# Patient Record
Sex: Female | Born: 1960 | Race: Black or African American | Hispanic: No | Smoking: Current every day smoker
Health system: Southern US, Community
[De-identification: ages and names within clinical notes are randomized; demographics above are authoritative.]

## PROBLEM LIST (undated history)

## (undated) DIAGNOSIS — I10 Essential (primary) hypertension: Secondary | ICD-10-CM

## (undated) DIAGNOSIS — C801 Malignant (primary) neoplasm, unspecified: Secondary | ICD-10-CM

## (undated) DIAGNOSIS — Z8673 Personal history of transient ischemic attack (TIA), and cerebral infarction without residual deficits: Secondary | ICD-10-CM

## (undated) DIAGNOSIS — E119 Type 2 diabetes mellitus without complications: Secondary | ICD-10-CM

## (undated) DIAGNOSIS — R569 Unspecified convulsions: Secondary | ICD-10-CM

## (undated) DIAGNOSIS — J45909 Unspecified asthma, uncomplicated: Secondary | ICD-10-CM

## (undated) DIAGNOSIS — Z720 Tobacco use: Secondary | ICD-10-CM

## (undated) DIAGNOSIS — I739 Peripheral vascular disease, unspecified: Secondary | ICD-10-CM

## (undated) DIAGNOSIS — M1990 Unspecified osteoarthritis, unspecified site: Secondary | ICD-10-CM

## (undated) DIAGNOSIS — I779 Disorder of arteries and arterioles, unspecified: Secondary | ICD-10-CM

## (undated) HISTORY — PX: OTHER SURGICAL HISTORY: SHX169

---

## 2016-11-08 ENCOUNTER — Emergency Department: Payer: Medicare Other

## 2016-11-08 ENCOUNTER — Emergency Department
Admission: EM | Admit: 2016-11-08 | Discharge: 2016-11-09 | Disposition: A | Discharge status: Home / Self Care | Payer: Medicare Other | Attending: Emergency Medicine | Admitting: Emergency Medicine

## 2016-11-08 ENCOUNTER — Encounter: Payer: Self-pay | Admitting: Emergency Medicine

## 2016-11-08 DIAGNOSIS — Z8543 Personal history of malignant neoplasm of ovary: Secondary | ICD-10-CM | POA: Diagnosis not present

## 2016-11-08 DIAGNOSIS — R079 Chest pain, unspecified: Secondary | ICD-10-CM | POA: Insufficient documentation

## 2016-11-08 DIAGNOSIS — E119 Type 2 diabetes mellitus without complications: Secondary | ICD-10-CM | POA: Insufficient documentation

## 2016-11-08 DIAGNOSIS — R0789 Other chest pain: Secondary | ICD-10-CM | POA: Diagnosis present

## 2016-11-08 DIAGNOSIS — J45909 Unspecified asthma, uncomplicated: Secondary | ICD-10-CM | POA: Diagnosis not present

## 2016-11-08 DIAGNOSIS — I10 Essential (primary) hypertension: Secondary | ICD-10-CM | POA: Diagnosis not present

## 2016-11-08 DIAGNOSIS — F17200 Nicotine dependence, unspecified, uncomplicated: Secondary | ICD-10-CM | POA: Diagnosis not present

## 2016-11-08 HISTORY — DX: Unspecified osteoarthritis, unspecified site: M19.90

## 2016-11-08 HISTORY — DX: Unspecified asthma, uncomplicated: J45.909

## 2016-11-08 HISTORY — DX: Essential (primary) hypertension: I10

## 2016-11-08 HISTORY — DX: Type 2 diabetes mellitus without complications: E11.9

## 2016-11-08 HISTORY — DX: Malignant (primary) neoplasm, unspecified: C80.1

## 2016-11-08 LAB — BASIC METABOLIC PANEL
ANION GAP: 5 (ref 5–15)
BUN: 24 mg/dL — AB (ref 6–20)
CHLORIDE: 109 mmol/L (ref 101–111)
CO2: 30 mmol/L (ref 22–32)
Calcium: 8.9 mg/dL (ref 8.9–10.3)
Creatinine, Ser: 1.23 mg/dL — ABNORMAL HIGH (ref 0.44–1.00)
GFR calc Af Amer: 56 mL/min — ABNORMAL LOW (ref >60)
GFR calc non Af Amer: 48 mL/min — ABNORMAL LOW (ref >60)
GLUCOSE: 113 mg/dL — AB (ref 65–99)
POTASSIUM: 3 mmol/L — AB (ref 3.5–5.1)
Sodium: 144 mmol/L (ref 135–145)

## 2016-11-08 LAB — TROPONIN I: Troponin I: <0.03 ng/mL (ref <0.03)

## 2016-11-08 LAB — CBC
HEMATOCRIT: 37.2 % (ref 35.0–47.0)
HEMOGLOBIN: 12.7 g/dL (ref 12.0–16.0)
MCH: 31.2 pg (ref 26.0–34.0)
MCHC: 34.2 g/dL (ref 32.0–36.0)
MCV: 91.3 fL (ref 80.0–100.0)
Platelets: 141 10*3/uL — ABNORMAL LOW (ref 150–440)
RBC: 4.08 MIL/uL (ref 3.80–5.20)
RDW: 15.8 % — AB (ref 11.5–14.5)
WBC: 8.1 10*3/uL (ref 3.6–11.0)

## 2016-11-08 MED ORDER — IPRATROPIUM-ALBUTEROL 0.5-2.5 (3) MG/3ML IN SOLN
3.0000 mL | Freq: Once | RESPIRATORY_TRACT | Status: AC
  Administered 2016-11-08: 3 mL via RESPIRATORY_TRACT
  Filled 2016-11-08: qty 3

## 2016-11-08 MED ORDER — GI COCKTAIL ~~LOC~~
30.0000 mL | Freq: Once | ORAL | Status: AC
Start: 2016-11-08 — End: 2016-11-08
  Administered 2016-11-08: 30 mL via ORAL
  Filled 2016-11-08: qty 30

## 2016-11-08 MED ORDER — LIDOCAINE 5 % EX PTCH
1.0000 | MEDICATED_PATCH | CUTANEOUS | Status: DC
  Administered 2016-11-08: 1 via TRANSDERMAL
  Filled 2016-11-08: qty 1

## 2016-11-08 MED ORDER — KETOROLAC TROMETHAMINE 30 MG/ML IJ SOLN
30.0000 mg | Freq: Once | INTRAMUSCULAR | Status: AC
  Administered 2016-11-08: 30 mg via INTRAVENOUS
  Filled 2016-11-08: qty 1

## 2016-11-08 NOTE — ED Notes (Signed)
Breathing treatment is complete. Patient to radiology with rad tech via stretcher. Patient's family members to go home at this time.

## 2016-11-08 NOTE — ED Triage Notes (Addendum)
Pt to rm 12 via EMS, EMS reports they found pt in car, shaking.  Pt c/o central chest pain, described as squeezing and sharp, no accompanying sx.  Pt reports she has had these pains since she was a teenager, but has never seen a cardiologist.  Pt appears confused upon arrival, having difficulty answering questions, unsure if this is pt's baseline.    EMS gave pt 324 baby asa and 1/2 inch nitro paste

## 2016-11-09 ENCOUNTER — Emergency Department: Payer: Medicare Other

## 2016-11-09 DIAGNOSIS — R079 Chest pain, unspecified: Secondary | ICD-10-CM | POA: Diagnosis not present

## 2016-11-09 LAB — FIBRIN DERIVATIVES D-DIMER (ARMC ONLY): FIBRIN DERIVATIVES D-DIMER (ARMC): 589 — AB (ref 0–499)

## 2016-11-09 LAB — BLOOD GAS, VENOUS
Acid-Base Excess: 5.9 mmol/L — ABNORMAL HIGH (ref 0.0–2.0)
Bicarbonate: 31.7 mmol/L — ABNORMAL HIGH (ref 20.0–28.0)
FIO2: 0.21
O2 Saturation: 68 %
PCO2 VEN: 50 mmHg (ref 44.0–60.0)
PH VEN: 7.41 (ref 7.250–7.430)
PO2 VEN: 35 mmHg (ref 32.0–45.0)
Patient temperature: 37

## 2016-11-09 LAB — TROPONIN I

## 2016-11-09 MED ORDER — POTASSIUM CHLORIDE CRYS ER 20 MEQ PO TBCR
40.0000 meq | EXTENDED_RELEASE_TABLET | Freq: Once | ORAL | Status: AC
  Administered 2016-11-09: 40 meq via ORAL
  Filled 2016-11-09: qty 2

## 2016-11-09 MED ORDER — ETODOLAC 200 MG PO CAPS: 200.0000 mg | ORAL_CAPSULE | Freq: Three times a day (TID) | ORAL | 0 refills | Status: DC

## 2016-11-09 MED ORDER — MAGNESIUM SULFATE 2 GM/50ML IV SOLN
2.0000 g | Freq: Once | INTRAVENOUS | Status: AC
  Administered 2016-11-09: 2 g via INTRAVENOUS
  Filled 2016-11-09: qty 50

## 2016-11-09 MED ORDER — IOPAMIDOL (ISOVUE-370) INJECTION 76%
75.0000 mL | Freq: Once | INTRAVENOUS | Status: AC | PRN
  Administered 2016-11-09: 75 mL via INTRAVENOUS

## 2016-11-09 NOTE — ED Notes (Signed)
Patient continues to sleep soundly. Awakens to touch. IV magnesium is complete. Patient is ready for discharge. Will call family to pick her up.

## 2016-11-09 NOTE — ED Notes (Signed)
Patient verbalized understanding of discharge instructions. Awaiting transportation.

## 2016-11-09 NOTE — Discharge Instructions (Signed)
Please follow-up with the acute care clinic for your chest pain. Your heart enzymes and heart evaluation is unremarkable. If you pain should return or worsen please return to the emergency department. The concerns please return to the emergency department.

## 2016-11-09 NOTE — ED Notes (Signed)
Patients daughter requesting a phone call for any changes.[phone]

## 2016-11-09 NOTE — ED Provider Notes (Signed)
[facility] Emergency Department Provider Note   ____________________________________________   First MD Initiated Contact with Patient 11/08/16 2325     (approximate)  I have reviewed the triage vital signs and the nursing notes.   HISTORY  Chief Complaint Chest Pain    HPI Jennifer Weiss is a 55 y.o. female who comes into the hospital today with chest pain. The patient reports that the pain started around lunchtime. The pain went away and then came back to see me. She was at her brother's house sitting and relaxing. She went to the bathroom and this pain started. The patient reports the pain is in the middle of her chest and feels like his pressure and something squeezing. Patient denies any radiation of her pain and denies any shortness of breath. Rates her pain is worse when she takes a deep breath. The pain is also worse when the patient moves. She's had some sweats but denies dizziness, nausea, vomiting. The patient has had this pain before and reports she's had all of her life. She sees Dr. Nicki Reaper in [place]. The patient currently rates her pain a 9 out of 10 in intensity. She has no abdominal pain and no headache. EMS gave the patient aspirin and Nitropaste. According to the patient's brother he felt like she stopped breathing and EMS revived her but that was not reported.   Past Medical History:  Diagnosis Date  . Arthritis   . Asthma   . Cancer ([place])    ovarian  . Diabetes mellitus without complication ([place])   . Hypertension     There are no active problems to display for this patient.   History reviewed. No pertinent surgical history.  Prior to Admission medications   Medication Sig Start Date End Date Taking? Authorizing Provider  etodolac (LODINE) 200 MG capsule Take 1 capsule (200 mg total) by mouth every 8 (eight) hours. 11/09/16   Loney Hering, MD    Allergies Nystatin  History reviewed. No pertinent family  history.  Social History Social History  Substance Use Topics  . Smoking status: Current Every Day Smoker  . Smokeless tobacco: Never Used  . Alcohol use No    Review of Systems Constitutional: Sweats Eyes: No visual changes. ENT: No sore throat. Cardiovascular:  chest pain. Respiratory: Denies shortness of breath. Gastrointestinal: No abdominal pain.  No nausea, no vomiting.  No diarrhea.  No constipation. Genitourinary: Negative for dysuria. Musculoskeletal: Negative for back pain. Skin: Negative for rash. Neurological: Negative for headaches, focal weakness or numbness.  10-point ROS otherwise negative.  ____________________________________________   PHYSICAL EXAM:  VITAL SIGNS: ED Triage Vitals [11/08/16 2315]  Enc Vitals Group     BP (!) 151/82     Pulse Rate 63     Resp 16     Temp 98.6 F (37 C)     Temp Source Oral     SpO2 96 %     Weight 220 lb (99.8 kg)     Height 5\' 3"  (1.6 m)     Head Circumference      Peak Flow      Pain Score 9     Pain Loc      Pain Edu?      Excl. in [place]?     Constitutional: Alert and oriented. Well appearing and in Moderate distress. Eyes: Conjunctivae are normal. PERRL. EOMI. Head: Atraumatic. Nose: No congestion/rhinnorhea. Mouth/Throat: Mucous membranes are moist.  Oropharynx non-erythematous. Cardiovascular: Normal rate, regular  rhythm. Grossly normal heart sounds.  Good peripheral circulation. Respiratory: Normal respiratory effort.  No retractions. Lungs CTAB. Tenderness to palpation of right chest wall Gastrointestinal: Soft and nontender. No distention. Positive bowel sounds Musculoskeletal: No lower extremity tenderness nor edema.   Neurologic:  Normal speech and language.  Skin:  Skin is warm, dry and intact.  Psychiatric: Mood and affect are normal.   ____________________________________________   LABS (all labs ordered are listed, but only abnormal results are displayed)  Labs Reviewed  BASIC METABOLIC  PANEL - Abnormal; Notable for the following:       Result Value   Potassium 3.0 (*)    Glucose, Bld 113 (*)    BUN 24 (*)    Creatinine, Ser 1.23 (*)    GFR calc non Af Amer 48 (*)    GFR calc Af Amer 56 (*)    All other components within normal limits  CBC - Abnormal; Notable for the following:    RDW 15.8 (*)    Platelets 141 (*)    All other components within normal limits  FIBRIN DERIVATIVES D-DIMER ([facility] ONLY) - Abnormal; Notable for the following:    Fibrin derivatives D-dimer ([facility]) 589 (*)    All other components within normal limits  BLOOD GAS, VENOUS - Abnormal; Notable for the following:    Bicarbonate 31.7 (*)    Acid-Base Excess 5.9 (*)    All other components within normal limits  TROPONIN I  TROPONIN I   ____________________________________________  EKG  ED ECG REPORT I, Loney Hering, the attending physician, personally viewed and interpreted this ECG.   Date: 11/08/2016  EKG Time: 2315  Rate: 80  Rhythm: normal sinus rhythm, PAC's noted  Axis: normal  Intervals:none  ST&T Change: none  ____________________________________________  RADIOLOGY  CXR CT angio chest ____________________________________________   PROCEDURES  Procedure(s) performed: None  Procedures  Critical Care performed: No  ____________________________________________   INITIAL IMPRESSION / ASSESSMENT AND PLAN / ED COURSE  Pertinent labs & imaging results that were available during my care of the patient were reviewed by me and considered in my medical decision making (see chart for details).  This is a 55 year old female who comes into the hospital today with chest pain. The patient reports that this pain has been going on all day and she's had it multiple times in the past. I will give the patient a Lidoderm patch to her chest that she is very tender to palpation in her chest. I will also give the patient a dose of ibuprofen. She will also receive a GI cocktail. I  will attempt to give her a DuoNeb as she did have some mild wheezing. I will evaluate the patient's imaging studies and I will reassess the patient when she's received everything.  Clinical Course as of Nov 10 351  Nancy Fetter Nov 09, 2016  [id number] Vascular congestion and mild cardiomegaly. Lungs remain grossly clear.   DG Chest 2 View [AW]  0201 No evidence of significant pulmonary embolus. No evidence of active pulmonary disease.   CT Angio Chest PE W and/or Wo Contrast [AW]    Clinical Course User Index [AW] Loney Hering, MD   Patient's chest pain is improved. Her CT as well as her chest x-ray is unremarkable. She has no further complaints at this time. The patient will be discharged home to follow-up with the acute care clinic.  ____________________________________________   FINAL CLINICAL IMPRESSION(S) / ED DIAGNOSES  Final diagnoses:  Chest pain,  unspecified type      NEW MEDICATIONS STARTED DURING THIS VISIT:  New Prescriptions   ETODOLAC (LODINE) 200 MG CAPSULE    Take 1 capsule (200 mg total) by mouth every 8 (eight) hours.     Note:  This document was prepared using Dragon voice recognition software and may include unintentional dictation errors.    Loney Hering, MD 11/09/16 [phone]

## 2016-11-09 NOTE — ED Notes (Signed)
Called pt's daughter, let her know plan to discharge, daughter request call again when pt up for discharge.  [phone]

## 2016-11-09 NOTE — ED Notes (Signed)
Patient asleep. Arouses with touch. IV meds running at this time. Will continue to monitor. Patient repositioned in bed for comfort.

## 2016-11-09 NOTE — ED Notes (Signed)
Pt ambulated to toilet with assistance, pt c/o chronic pain to right leg.  When asked if she normally uses an assistive device, pt states no but states she needs something.  Walker discussed with MD and to be provided at discharge

## 2016-11-09 NOTE — ED Notes (Addendum)
Pt's female family member out to nurse's station, family member given update, family member tried to explain to this nurse what happened to pt PTA, misunderstanding took place and family member became angry.  Family member was stating pt was not breathing and was "brought back", this was misinterpreted by this nurse that pt was picked up by EMS, brought back home, but then later brought to hospital.  Of note, respiratory arrest never mentioned by EMS.  [employer] to nurse's situation and escorted family member to lobby

## 2016-11-09 NOTE — ED Notes (Signed)
Patient returned from xray.

## 2016-11-19 ENCOUNTER — Emergency Department: Payer: Medicare Other

## 2016-11-19 ENCOUNTER — Encounter: Payer: Self-pay | Admitting: Emergency Medicine

## 2016-11-19 ENCOUNTER — Emergency Department
Admission: EM | Admit: 2016-11-19 | Discharge: 2016-11-19 | Disposition: A | Discharge status: Home / Self Care | Payer: Medicare Other | Attending: Emergency Medicine | Admitting: Emergency Medicine

## 2016-11-19 DIAGNOSIS — J45909 Unspecified asthma, uncomplicated: Secondary | ICD-10-CM | POA: Insufficient documentation

## 2016-11-19 DIAGNOSIS — F17200 Nicotine dependence, unspecified, uncomplicated: Secondary | ICD-10-CM | POA: Insufficient documentation

## 2016-11-19 DIAGNOSIS — I10 Essential (primary) hypertension: Secondary | ICD-10-CM | POA: Insufficient documentation

## 2016-11-19 DIAGNOSIS — Z8543 Personal history of malignant neoplasm of ovary: Secondary | ICD-10-CM | POA: Diagnosis not present

## 2016-11-19 DIAGNOSIS — R42 Dizziness and giddiness: Secondary | ICD-10-CM | POA: Insufficient documentation

## 2016-11-19 DIAGNOSIS — E119 Type 2 diabetes mellitus without complications: Secondary | ICD-10-CM | POA: Insufficient documentation

## 2016-11-19 LAB — CBC
HCT: 37.1 % (ref 35.0–47.0)
Hemoglobin: 12.4 g/dL (ref 12.0–16.0)
MCH: 30.3 pg (ref 26.0–34.0)
MCHC: 33.5 g/dL (ref 32.0–36.0)
MCV: 90.4 fL (ref 80.0–100.0)
PLATELETS: 176 10*3/uL (ref 150–440)
RBC: 4.1 MIL/uL (ref 3.80–5.20)
RDW: 16.2 % — AB (ref 11.5–14.5)
WBC: 7 10*3/uL (ref 3.6–11.0)

## 2016-11-19 LAB — BASIC METABOLIC PANEL
ANION GAP: 9 (ref 5–15)
BUN: 18 mg/dL (ref 6–20)
CALCIUM: 9.1 mg/dL (ref 8.9–10.3)
CO2: 26 mmol/L (ref 22–32)
Chloride: 107 mmol/L (ref 101–111)
Creatinine, Ser: 0.93 mg/dL (ref 0.44–1.00)
GLUCOSE: 93 mg/dL (ref 65–99)
Potassium: 3.3 mmol/L — ABNORMAL LOW (ref 3.5–5.1)
Sodium: 142 mmol/L (ref 135–145)

## 2016-11-19 LAB — TROPONIN I
Troponin I: <0.03 ng/mL (ref <0.03)
Troponin I: <0.03 ng/mL (ref <0.03)

## 2016-11-19 MED ORDER — CLONIDINE HCL 0.1 MG PO TABS
0.1000 mg | ORAL_TABLET | Freq: Once | ORAL | Status: AC
  Administered 2016-11-19: 0.1 mg via ORAL
  Filled 2016-11-19: qty 1

## 2016-11-19 MED ORDER — SODIUM CHLORIDE 0.9 % IV BOLUS (SEPSIS)
500.0000 mL | Freq: Once | INTRAVENOUS | Status: AC
  Administered 2016-11-19: 500 mL via INTRAVENOUS

## 2016-11-19 MED ORDER — KETOROLAC TROMETHAMINE 30 MG/ML IJ SOLN
30.0000 mg | Freq: Once | INTRAMUSCULAR | Status: AC
  Administered 2016-11-19: 30 mg via INTRAVENOUS
  Filled 2016-11-19: qty 1

## 2016-11-19 MED ORDER — IPRATROPIUM-ALBUTEROL 0.5-2.5 (3) MG/3ML IN SOLN
3.0000 mL | Freq: Once | RESPIRATORY_TRACT | Status: AC
  Administered 2016-11-19: 3 mL via RESPIRATORY_TRACT
  Filled 2016-11-19: qty 3

## 2016-11-19 NOTE — ED Triage Notes (Addendum)
Pt to room 6 via EMS from home; no distress noted; EMS reports pt's BP 210/98; pt reports awoke PTA with dizziness; st hx of same with HTN; reports mid CP which is constant and chronic; resp even/unlab, lungs clear, apical audible & regular, +BS, abd soft/nondist/nontender, +PP, -edema; pt reports seeing her PCP yesterday for physical and BP was elevated, rx new medication (unknow) but has not filled yet

## 2016-11-19 NOTE — ED Notes (Signed)
Pt resting quietly in darkened exam room with eyes closed, snoring resp; awakens easily with stimuli; st decreased CP at present, 4/10

## 2016-11-19 NOTE — ED Notes (Signed)
Pt returns from xray; voices good understanding of plan of care and medication to be admin

## 2016-11-19 NOTE — ED Provider Notes (Signed)
Sign out from Dr. Dahlia Client in this 55 year old female presenting with dizziness. History of hypertension as well as diabetes. Evaluated by Dr. Dahlia Client and found to be neuro intact without any dizziness. Plan is to follow the second troponin and discharge if stable.  Physical Exam  BP (!) 178/96   Pulse (!) 49   Temp 98 F (36.7 C) (Oral)   Resp 17   Ht 5\' 9"  (1.753 m)   Wt 209 lb (94.8 kg)   SpO2 96%   BMI 30.86 kg/m  ----------------------------------------- 9:45 AM on 11/19/2016 -----------------------------------------   Physical Exam Patient resting couple bits time. ED Course  Procedures  MDM No further complaints of dizziness or vertigo here in the emergency department. Blood pressure improved from EMS transfer. Patient will be discharged home with follow-up with her primary care doctor. Explained the plan to the patient and she is understandable and willing to comply.       Orbie Pyo, MD 11/19/16 [phone]

## 2016-11-19 NOTE — ED Provider Notes (Signed)
[facility] Emergency Department Provider Note   ____________________________________________   First MD Initiated Contact with Patient 11/19/16 [phone]     (approximate)  I have reviewed the triage vital signs and the nursing notes.   HISTORY  Chief Complaint Dizziness    HPI Jennifer Weiss is a 55 y.o. female who comes into the hospital today with some dizziness. The patient reports that she went to the doctor yesterday and her blood pressure was high. The patient was given a prescription for a new medication for her blood pressure but reports that she has not yet filled the prescription. She does not remember how high her blood pressure was. This evening she got up to use the restroom and reports that she had a dizzy spell. It started around 2:30 and it lasted about an hour. The patient reports that the symptoms have resolved but she was concerned. The patient reports that she just feels bad so she wanted to get checked out. The patient does have some chest pain but denies any shortness of breath. She reports that she is dizzy with some mild room spinning but nothing severe. The patient denies any nausea vomiting or abdominal pain. She's had no urinary symptoms. The patient is here today for evaluation.According to EMS the patient's blood pressure was 210/98. The patient did state that she does have some dizziness when her blood pressure is elevated.   Past Medical History:  Diagnosis Date  . Arthritis   . Asthma   . Cancer ([place])    ovarian  . Diabetes mellitus without complication ([place])   . Hypertension     There are no active problems to display for this patient.   History reviewed. No pertinent surgical history.  Prior to Admission medications   Medication Sig Start Date End Date Taking? Authorizing Provider  etodolac (LODINE) 200 MG capsule Take 1 capsule (200 mg total) by mouth every 8 (eight) hours. 11/09/16   Loney Hering, MD     Allergies Nystatin  No family history on file.  Social History Social History  Substance Use Topics  . Smoking status: Current Every Day Smoker  . Smokeless tobacco: Never Used  . Alcohol use No    Review of Systems Constitutional: No fever/chills Eyes: No visual changes. ENT: No sore throat. Cardiovascular:  chest pain. Respiratory: Denies shortness of breath. Gastrointestinal: No abdominal pain.  No nausea, no vomiting.  No diarrhea.  No constipation. Genitourinary: Negative for dysuria. Musculoskeletal: Negative for back pain. Skin: Negative for rash. Neurological: Dizziness   10-point ROS otherwise negative.  ____________________________________________   PHYSICAL EXAM:  VITAL SIGNS: ED Triage Vitals  Enc Vitals Group     BP 11/19/16 0425 (!) 182/82     Pulse Rate 11/19/16 0425 (!) 49     Resp 11/19/16 0425 20     Temp 11/19/16 0425 98 F (36.7 C)     Temp Source 11/19/16 0425 Oral     SpO2 11/19/16 0425 99 %     Weight 11/19/16 0437 209 lb (94.8 kg)     Height 11/19/16 0437 5\' 9"  (1.753 m)     Head Circumference --      Peak Flow --      Pain Score 11/19/16 0438 8     Pain Loc --      Pain Edu? --      Excl. in [place]? --     Constitutional: Alert and oriented. Well appearing and in Mild distress. Eyes:  Conjunctivae are normal. PERRL. EOMI. Head: Atraumatic. Nose: No congestion/rhinnorhea. Mouth/Throat: Mucous membranes are moist.  Oropharynx non-erythematous. Cardiovascular: Normal rate, regular rhythm. Grossly normal heart sounds.  Good peripheral circulation. Respiratory: Normal respiratory effort.  No retractions. Lungs CTAB. Gastrointestinal: Soft and nontender. No distention. Positive bowel sounds Musculoskeletal: Right shoulder pain with movement   Neurologic:  Normal speech and language. Renal nerves II through XII grossly intact with no focal motor or neuro deficits Skin:  Skin is warm, dry and intact.  Psychiatric: Mood and affect are  normal.   ____________________________________________   LABS (all labs ordered are listed, but only abnormal results are displayed)  Labs Reviewed  CBC - Abnormal; Notable for the following:       Result Value   RDW 16.2 (*)    All other components within normal limits  BASIC METABOLIC PANEL - Abnormal; Notable for the following:    Potassium 3.3 (*)    All other components within normal limits  TROPONIN I   ____________________________________________  EKG  ED ECG REPORT I, Loney Hering, the attending physician, personally viewed and interpreted this ECG.   Date: 11/19/2016  EKG Time: 429  Rate: 50  Rhythm: sinus bradycardia  Axis: normal  Intervals:none  ST&T Change: Flipped T-wave in lead 3 diffuse T-wave flattening  ____________________________________________  RADIOLOGY  CXR ____________________________________________   PROCEDURES  Procedure(s) performed: None  Procedures  Critical Care performed: No  ____________________________________________   INITIAL IMPRESSION / ASSESSMENT AND PLAN / ED COURSE  Pertinent labs & imaging results that were available during my care of the patient were reviewed by me and considered in my medical decision making (see chart for details).  This is a 55 year old female who comes into the hospital today with elevated blood pressure and dizziness. The patient does have dizziness normally when her blood pressure is up and she was seen at her primary care physician's office today with elevated blood pressure. I will give the patient a dose of Toradol for her shoulder pain. Her initial blood work looks unremarkable as she does have some chest pain I will evaluate a second troponin. The patient will then be reassessed. The patient's care will be signed out to Dr.Schaevitz who will follow up the results and disposition the patient.   Clinical Course as of Nov 19 706  Wed Nov 19, 2016  [id number] Unchanged cardiomegaly.  No  consolidation.  No effusion. DG Chest 2 View [AW]    Clinical Course User Index [AW] Loney Hering, MD     ____________________________________________   FINAL CLINICAL IMPRESSION(S) / ED DIAGNOSES  Final diagnoses:  Dizziness      NEW MEDICATIONS STARTED DURING THIS VISIT:  New Prescriptions   No medications on file     Note:  This document was prepared using Dragon voice recognition software and may include unintentional dictation errors.    Loney Hering, MD 11/19/16 [phone]

## 2016-12-31 ENCOUNTER — Encounter: Payer: Self-pay | Admitting: *Deleted

## 2016-12-31 ENCOUNTER — Observation Stay
Admission: EM | Admit: 2016-12-31 | Discharge: 2017-01-02 | Disposition: A | Discharge status: Home / Self Care | Payer: Medicare Other | Attending: Internal Medicine | Admitting: Internal Medicine

## 2016-12-31 ENCOUNTER — Emergency Department: Payer: Medicare Other

## 2016-12-31 DIAGNOSIS — N179 Acute kidney failure, unspecified: Secondary | ICD-10-CM | POA: Insufficient documentation

## 2016-12-31 DIAGNOSIS — E119 Type 2 diabetes mellitus without complications: Secondary | ICD-10-CM | POA: Diagnosis not present

## 2016-12-31 DIAGNOSIS — J111 Influenza due to unidentified influenza virus with other respiratory manifestations: Secondary | ICD-10-CM | POA: Insufficient documentation

## 2016-12-31 DIAGNOSIS — Z888 Allergy status to other drugs, medicaments and biological substances status: Secondary | ICD-10-CM | POA: Diagnosis not present

## 2016-12-31 DIAGNOSIS — Z833 Family history of diabetes mellitus: Secondary | ICD-10-CM | POA: Diagnosis not present

## 2016-12-31 DIAGNOSIS — E860 Dehydration: Secondary | ICD-10-CM | POA: Insufficient documentation

## 2016-12-31 DIAGNOSIS — Z8543 Personal history of malignant neoplasm of ovary: Secondary | ICD-10-CM | POA: Insufficient documentation

## 2016-12-31 DIAGNOSIS — R0902 Hypoxemia: Secondary | ICD-10-CM | POA: Diagnosis present

## 2016-12-31 DIAGNOSIS — Z7984 Long term (current) use of oral hypoglycemic drugs: Secondary | ICD-10-CM | POA: Insufficient documentation

## 2016-12-31 DIAGNOSIS — E876 Hypokalemia: Secondary | ICD-10-CM | POA: Insufficient documentation

## 2016-12-31 DIAGNOSIS — Z23 Encounter for immunization: Secondary | ICD-10-CM | POA: Insufficient documentation

## 2016-12-31 DIAGNOSIS — I10 Essential (primary) hypertension: Secondary | ICD-10-CM | POA: Insufficient documentation

## 2016-12-31 DIAGNOSIS — Z7951 Long term (current) use of inhaled steroids: Secondary | ICD-10-CM | POA: Insufficient documentation

## 2016-12-31 DIAGNOSIS — F17210 Nicotine dependence, cigarettes, uncomplicated: Secondary | ICD-10-CM | POA: Insufficient documentation

## 2016-12-31 DIAGNOSIS — J9601 Acute respiratory failure with hypoxia: Secondary | ICD-10-CM | POA: Diagnosis not present

## 2016-12-31 DIAGNOSIS — J209 Acute bronchitis, unspecified: Secondary | ICD-10-CM | POA: Insufficient documentation

## 2016-12-31 DIAGNOSIS — Z79899 Other long term (current) drug therapy: Secondary | ICD-10-CM | POA: Insufficient documentation

## 2016-12-31 DIAGNOSIS — J45909 Unspecified asthma, uncomplicated: Secondary | ICD-10-CM | POA: Insufficient documentation

## 2016-12-31 LAB — INFLUENZA PANEL BY PCR (TYPE A & B)
INFLAPCR: POSITIVE — AB
INFLBPCR: NEGATIVE

## 2016-12-31 LAB — COMPREHENSIVE METABOLIC PANEL
ALK PHOS: 71 U/L (ref 38–126)
ALT: 21 U/L (ref 14–54)
ANION GAP: 12 (ref 5–15)
AST: 42 U/L — ABNORMAL HIGH (ref 15–41)
Albumin: 3.5 g/dL (ref 3.5–5.0)
BILIRUBIN TOTAL: 0.7 mg/dL (ref 0.3–1.2)
BUN: 21 mg/dL — ABNORMAL HIGH (ref 6–20)
CALCIUM: 9.2 mg/dL (ref 8.9–10.3)
CO2: 30 mmol/L (ref 22–32)
CREATININE: 1.24 mg/dL — AB (ref 0.44–1.00)
Chloride: 96 mmol/L — ABNORMAL LOW (ref 101–111)
GFR calc non Af Amer: 48 mL/min — ABNORMAL LOW (ref >60)
GFR, EST AFRICAN AMERICAN: 55 mL/min — AB (ref >60)
GLUCOSE: 132 mg/dL — AB (ref 65–99)
Potassium: 2.3 mmol/L — CL (ref 3.5–5.1)
SODIUM: 138 mmol/L (ref 135–145)
TOTAL PROTEIN: 7.4 g/dL (ref 6.5–8.1)

## 2016-12-31 LAB — CBC
HEMATOCRIT: 36.6 % (ref 35.0–47.0)
HEMOGLOBIN: 12.6 g/dL (ref 12.0–16.0)
MCH: 30.5 pg (ref 26.0–34.0)
MCHC: 34.4 g/dL (ref 32.0–36.0)
MCV: 88.6 fL (ref 80.0–100.0)
Platelets: 190 10*3/uL (ref 150–440)
RBC: 4.14 MIL/uL (ref 3.80–5.20)
RDW: 14.4 % (ref 11.5–14.5)
WBC: 7.7 10*3/uL (ref 3.6–11.0)

## 2016-12-31 LAB — URINALYSIS, COMPLETE (UACMP) WITH MICROSCOPIC
BILIRUBIN URINE: NEGATIVE
Bacteria, UA: NONE SEEN
Glucose, UA: NEGATIVE mg/dL
Hgb urine dipstick: NEGATIVE
KETONES UR: NEGATIVE mg/dL
Leukocytes, UA: NEGATIVE
Nitrite: NEGATIVE
PH: 5 (ref 5.0–8.0)
PROTEIN: NEGATIVE mg/dL
Specific Gravity, Urine: 1.016 (ref 1.005–1.030)

## 2016-12-31 LAB — TROPONIN I

## 2016-12-31 LAB — TSH: TSH: 1.88 u[IU]/mL (ref 0.350–4.500)

## 2016-12-31 LAB — LIPASE, BLOOD: Lipase: 29 U/L (ref 11–51)

## 2016-12-31 MED ORDER — OSELTAMIVIR PHOSPHATE 75 MG PO CAPS
75.0000 mg | ORAL_CAPSULE | Freq: Once | ORAL | Status: AC
Start: 2016-12-31 — End: 2016-12-31
  Administered 2016-12-31: 75 mg via ORAL
  Filled 2016-12-31: qty 1

## 2016-12-31 MED ORDER — FLUOXETINE HCL 10 MG PO CAPS
10.0000 mg | ORAL_CAPSULE | Freq: Every day | ORAL | Status: DC
  Administered 2016-12-31 – 2017-01-02 (×3): 10 mg via ORAL
  Filled 2016-12-31 (×3): qty 1

## 2016-12-31 MED ORDER — OSELTAMIVIR PHOSPHATE 75 MG PO CAPS: 75.0000 mg | ORAL_CAPSULE | Freq: Two times a day (BID) | ORAL | Status: DC

## 2016-12-31 MED ORDER — METFORMIN HCL 500 MG PO TABS
500.0000 mg | ORAL_TABLET | Freq: Two times a day (BID) | ORAL | Status: DC
  Administered 2016-12-31 – 2017-01-02 (×4): 500 mg via ORAL
  Filled 2016-12-31 (×4): qty 1

## 2016-12-31 MED ORDER — IPRATROPIUM-ALBUTEROL 0.5-2.5 (3) MG/3ML IN SOLN
3.0000 mL | Freq: Once | RESPIRATORY_TRACT | Status: AC
  Administered 2016-12-31: 3 mL via RESPIRATORY_TRACT
  Filled 2016-12-31: qty 3

## 2016-12-31 MED ORDER — CHLORTHALIDONE 25 MG PO TABS
25.0000 mg | ORAL_TABLET | Freq: Every day | ORAL | Status: DC
  Administered 2016-12-31 – 2017-01-02 (×3): 25 mg via ORAL
  Filled 2016-12-31 (×3): qty 1

## 2016-12-31 MED ORDER — OSELTAMIVIR PHOSPHATE 30 MG PO CAPS
30.0000 mg | ORAL_CAPSULE | Freq: Two times a day (BID) | ORAL | Status: DC
  Administered 2016-12-31 – 2017-01-01 (×2): 30 mg via ORAL
  Filled 2016-12-31 (×2): qty 1

## 2016-12-31 MED ORDER — SODIUM CHLORIDE 0.9 % IV SOLN
INTRAVENOUS | Status: DC
  Administered 2016-12-31: 10:00:00 via INTRAVENOUS

## 2016-12-31 MED ORDER — ONDANSETRON HCL 4 MG PO TABS: 4.0000 mg | ORAL_TABLET | Freq: Four times a day (QID) | ORAL | Status: DC | PRN

## 2016-12-31 MED ORDER — HEPARIN SODIUM (PORCINE) 5000 UNIT/ML IJ SOLN
5000.0000 [IU] | Freq: Three times a day (TID) | INTRAMUSCULAR | Status: DC
  Administered 2016-12-31 – 2017-01-02 (×7): 5000 [IU] via SUBCUTANEOUS
  Filled 2016-12-31 (×7): qty 1

## 2016-12-31 MED ORDER — ACETAMINOPHEN 650 MG RE SUPP: 650.0000 mg | Freq: Four times a day (QID) | RECTAL | Status: DC | PRN

## 2016-12-31 MED ORDER — ATENOLOL 50 MG PO TABS
100.0000 mg | ORAL_TABLET | Freq: Every day | ORAL | Status: DC
  Administered 2016-12-31 – 2017-01-02 (×3): 100 mg via ORAL
  Filled 2016-12-31 (×3): qty 2

## 2016-12-31 MED ORDER — ONDANSETRON HCL 4 MG/2ML IJ SOLN: 4.0000 mg | Freq: Four times a day (QID) | INTRAMUSCULAR | Status: DC | PRN

## 2016-12-31 MED ORDER — ATENOLOL-CHLORTHALIDONE 100-25 MG PO TABS: 1.0000 | ORAL_TABLET | Freq: Every day | ORAL | Status: DC

## 2016-12-31 MED ORDER — POTASSIUM CHLORIDE 20 MEQ PO PACK
40.0000 meq | PACK | Freq: Two times a day (BID) | ORAL | Status: DC
  Filled 2016-12-31: qty 2

## 2016-12-31 MED ORDER — PREDNISONE 50 MG PO TABS
50.0000 mg | ORAL_TABLET | Freq: Every day | ORAL | Status: DC
  Administered 2016-12-31 – 2017-01-02 (×3): 50 mg via ORAL
  Filled 2016-12-31 (×3): qty 1

## 2016-12-31 MED ORDER — POTASSIUM CHLORIDE 20 MEQ PO PACK
40.0000 meq | PACK | Freq: Once | ORAL | Status: AC
  Administered 2016-12-31: 40 meq via ORAL
  Filled 2016-12-31: qty 2

## 2016-12-31 MED ORDER — ACETAMINOPHEN 325 MG PO TABS
650.0000 mg | ORAL_TABLET | Freq: Four times a day (QID) | ORAL | Status: DC | PRN
  Administered 2016-12-31 – 2017-01-01 (×2): 650 mg via ORAL
  Filled 2016-12-31 (×2): qty 2

## 2016-12-31 MED ORDER — ATORVASTATIN CALCIUM 20 MG PO TABS
40.0000 mg | ORAL_TABLET | Freq: Every day | ORAL | Status: DC
  Administered 2016-12-31 – 2017-01-01 (×2): 40 mg via ORAL
  Filled 2016-12-31 (×2): qty 2

## 2016-12-31 MED ORDER — DOCUSATE SODIUM 100 MG PO CAPS
100.0000 mg | ORAL_CAPSULE | Freq: Two times a day (BID) | ORAL | Status: DC
  Administered 2016-12-31 – 2017-01-02 (×5): 100 mg via ORAL
  Filled 2016-12-31 (×5): qty 1

## 2016-12-31 MED ORDER — POTASSIUM CHLORIDE 20 MEQ PO PACK
40.0000 meq | PACK | Freq: Two times a day (BID) | ORAL | Status: DC
  Administered 2016-12-31 – 2017-01-02 (×5): 40 meq via ORAL
  Filled 2016-12-31 (×5): qty 2

## 2016-12-31 MED ORDER — INFLUENZA VAC SPLIT QUAD 0.5 ML IM SUSY
0.5000 mL | PREFILLED_SYRINGE | INTRAMUSCULAR | Status: AC
  Administered 2017-01-01: 08:00:00 0.5 mL via INTRAMUSCULAR
  Filled 2016-12-31: qty 0.5

## 2016-12-31 MED ORDER — BUDESONIDE 0.5 MG/2ML IN SUSP
0.5000 mg | Freq: Two times a day (BID) | RESPIRATORY_TRACT | Status: DC
  Administered 2016-12-31 – 2017-01-02 (×4): 0.5 mg via RESPIRATORY_TRACT
  Filled 2016-12-31 (×4): qty 2

## 2016-12-31 MED ORDER — GABAPENTIN 300 MG PO CAPS
900.0000 mg | ORAL_CAPSULE | Freq: Three times a day (TID) | ORAL | Status: DC
  Administered 2016-12-31 – 2017-01-02 (×6): 900 mg via ORAL
  Filled 2016-12-31 (×6): qty 3

## 2016-12-31 NOTE — ED Provider Notes (Signed)
[facility] Emergency Department Provider Note __   First MD Initiated Contact with Patient 12/31/16 0250     (approximate)  I have reviewed the triage vital signs and the nursing notes.   HISTORY  Chief Complaint Shortness of Breath   HPI Jennifer Weiss is a 56 y.o. female with below list of chronic medical conditions presents to the emergency department with acute onset of generalized myalgia subjective fevers cough or chest discomfort and abdominal pain tonight. Patient admits to left-sided chest pain and shortness of breath 1 week. Patient denies any pain at present. Patient does admit to positive tobacco use daily stating at one point she smoked 3 packs per day for many years Past Medical History:  Diagnosis Date  . Arthritis   . Asthma   . Cancer ([place])    ovarian  . Diabetes mellitus without complication ([place])   . Hypertension     There are no active problems to display for this patient.   No past surgical history on file.  Prior to Admission medications   Medication Sig Start Date End Date Taking? Authorizing Provider  etodolac (LODINE) 200 MG capsule Take 1 capsule (200 mg total) by mouth every 8 (eight) hours. 11/09/16   Loney Hering, MD    Allergies Nystatin  No family history on file.  Social History Social History  Substance Use Topics  . Smoking status: Current Every Day Smoker  . Smokeless tobacco: Never Used  . Alcohol use No    Review of Systems Constitutional: Positive for fever/chills Eyes: No visual changes. ENT: No sore throat. Cardiovascular: Denies chest pain. Respiratory: Positive for shortness of breath. Positive for cough Gastrointestinal: No abdominal pain.  No nausea, no vomiting.  No diarrhea.  No constipation. Genitourinary: Negative for dysuria. Musculoskeletal: Negative for back pain. Skin: Negative for rash. Neurological: Negative for headaches, focal weakness or numbness.  10-point ROS  otherwise negative.  ____________________________________________   PHYSICAL EXAM:  VITAL SIGNS: ED Triage Vitals [12/31/16 0258]  Enc Vitals Group     BP 134/76     Pulse Rate 79     Resp 20     Temp 98.4 F (36.9 C)     Temp Source Oral     SpO2 94 %     Weight 200 lb (90.7 kg)     Height 5\' 6"  (1.676 m)     Head Circumference      Peak Flow      Pain Score 10     Pain Loc      Pain Edu?      Excl. in [place]?     Constitutional: Alert and oriented. Well appearing and in no acute distress. Eyes: Conjunctivae are normal. PERRL. EOMI. Head: Atraumatic. Mouth/Throat: Mucous membranes are moist.  Oropharynx non-erythematous. Neck: No stridor.   Cardiovascular: Normal rate, regular rhythm. Good peripheral circulation. Grossly normal heart sounds. Respiratory: Normal respiratory effort.  No retractions. Lungs CTAB. Gastrointestinal: Soft and nontender. No distention.  Musculoskeletal: No lower extremity tenderness nor edema. No gross deformities of extremities. Neurologic:  Normal speech and language. No gross focal neurologic deficits are appreciated.  Skin:  Skin is warm, dry and intact. No rash noted.   ____________________________________________   LABS (all labs ordered are listed, but only abnormal results are displayed)  Labs Reviewed  COMPREHENSIVE METABOLIC PANEL - Abnormal; Notable for the following:       Result Value   Potassium 2.3 (*)    Chloride 96 (*)  Glucose, Bld 132 (*)    BUN 21 (*)    Creatinine, Ser 1.24 (*)    AST 42 (*)    GFR calc non Af Amer 48 (*)    GFR calc Af Amer 55 (*)    All other components within normal limits  URINALYSIS, COMPLETE (UACMP) WITH MICROSCOPIC - Abnormal; Notable for the following:    Color, Urine YELLOW (*)    APPearance CLEAR (*)    Squamous Epithelial / LPF 0-5 (*)    All other components within normal limits  INFLUENZA PANEL BY PCR (TYPE A & B) - Abnormal; Notable for the following:    Influenza A By PCR  POSITIVE (*)    All other components within normal limits  CBC  LIPASE, BLOOD  TROPONIN I   ____________________________________________  EKG  ED ECG REPORT I, [place] N Kenrick Pore, the attending physician, personally viewed and interpreted this ECG.   Date: 12/31/2016  EKG Time: 2:53 AM  Rate: 78  Rhythm: Normal sinus rhythm  Axis: Normal  Intervals: Normal  ST&T Change: None  ____________________________________________  RADIOLOGY I, Owasa N Meridee Branum, personally viewed and evaluated these images (plain radiographs) as part of my medical decision making, as well as reviewing the written report by the radiologist.  Dg Chest 2 View  Result Date: 12/31/2016 CLINICAL DATA:  56 y/o F; left-sided chest pain and shortness of breath for 1 week. EXAM: CHEST  2 VIEW COMPARISON:  11/19/2016 chest radiograph FINDINGS: The heart size and mediastinal contours are within normal limits and stable. Linear opacities at lung bases probably represent atelectasis. No focal consolidation. The visualized skeletal structures are unremarkable. IMPRESSION: Minor bibasilar atelectasis.  No focal consolidation. Electronically Signed   By: Kristine Garbe M.D.   On: 12/31/2016 03:19     Procedures     INITIAL IMPRESSION / ASSESSMENT AND PLAN / ED COURSE  Pertinent labs & imaging results that were available during my care of the patient were reviewed by me and considered in my medical decision making (see chart for details).  History physical exam concern for possible influenza which was confirmed in the emergency department. Patient hypoxic multiple times in the ED with O2 saturation 89% as such patient placed on 2 L nasal cannula. Given forms a with hypoxia patient will be admitted to the hospital   Clinical Course     ____________________________________________  FINAL CLINICAL IMPRESSION(S) / ED DIAGNOSES  Final diagnoses:  Influenza  Hypoxia     MEDICATIONS GIVEN DURING THIS  VISIT:  Medications  potassium chloride (KLOR-CON) packet 40 mEq (not administered)  oseltamivir (TAMIFLU) capsule 75 mg (not administered)  potassium chloride (KLOR-CON) packet 40 mEq (40 mEq Oral Given 12/31/16 0415)     NEW OUTPATIENT MEDICATIONS STARTED DURING THIS VISIT:  New Prescriptions   No medications on file    Modified Medications   No medications on file    Discontinued Medications   No medications on file     Note:  This document was prepared using Dragon voice recognition software and may include unintentional dictation errors.    Gregor Hams, MD 12/31/16 [phone]

## 2016-12-31 NOTE — ED Notes (Signed)
Pt up to restroom. Denies pain. Appears slightly more sob and cough is more frequent then previously noted. MD aware. Order received.

## 2016-12-31 NOTE — ED Notes (Signed)
Attempted to call report. Was told they were going to wait for day shift to come in to take report.

## 2016-12-31 NOTE — ED Notes (Signed)
Pt up to restroom independently. Ambulates with a steady gait. No distress noted at this time.

## 2016-12-31 NOTE — ED Notes (Signed)
breathing tx completed. Pt states she is feeling a little better. Pt resting on stretcher with eyes closed and even respirations. Provided for safety and comfort and will continue to assess.

## 2016-12-31 NOTE — H&P (Signed)
Jennifer Weiss is an 56 y.o. female.   Chief Complaint: Shortness of breath HPI: The patient with past medical history of ovarian cancer asthma asthma and diabetes presents to the emergency department complaining of shortness of breath. She states that she is to subjective fevers with aches and chills. The patient denies chest pain, nausea, vomiting or diaphoresis. Laboratory evaluation revealed the patient is positive for influenza. Attempted cannulation resulted and mild hypoxia as well as tachypnea. She was also found to be hypokalemic with mild acute kidney injury which prompted the emergency department staff to call hospitalist service for admission.  Past Medical History:  Diagnosis Date  . Arthritis   . Asthma   . Cancer ([place])    ovarian  . Diabetes mellitus without complication ([place])   . Hypertension     Past Surgical History:  Procedure Laterality Date  . none      Family History  Problem Relation Age of Onset  . Diabetes Mellitus II Mother    Social History:  reports that she has been smoking Cigarettes.  She has been smoking about 0.50 packs per day. She has never used smokeless tobacco. She reports that she does not drink alcohol. Her drug history is not on file.  Allergies:  Allergies  Allergen Reactions  . Nystatin Other (See Comments)    Reaction: unknown    Medications Prior to Admission  Medication Sig Dispense Refill  . acyclovir (ZOVIRAX) 800 MG tablet Take 800 mg by mouth daily.    Marland Kitchen albuterol (PROVENTIL HFA;VENTOLIN HFA) 108 (90 Base) MCG/ACT inhaler Inhale 2 puffs into the lungs every 4 (four) hours as needed for wheezing or shortness of breath.    Marland Kitchen atenolol-chlorthalidone (TENORETIC) 100-25 MG tablet Take 1 tablet by mouth daily.    Marland Kitchen atorvastatin (LIPITOR) 40 MG tablet Take 40 mg by mouth daily.    Marland Kitchen FLUoxetine (PROZAC) 10 MG capsule Take 10 mg by mouth daily.    . fluticasone (FLOVENT HFA) 110 MCG/ACT inhaler Inhale 2 puffs into the lungs 2 (two) times  daily.    Marland Kitchen gabapentin (NEURONTIN) 300 MG capsule Take 900 mg by mouth 3 (three) times daily.    Marland Kitchen ipratropium (ATROVENT HFA) 17 MCG/ACT inhaler Inhale 2 puffs into the lungs 2 (two) times daily.    . metFORMIN (GLUCOPHAGE) 500 MG tablet Take 500 mg by mouth 2 (two) times daily with a meal.      Results for orders placed or performed during the hospital encounter of 12/31/16 (from the past 48 hour(s))  CBC     Status: None   Collection Time: 12/31/16  2:56 AM  Result Value Ref Range   WBC 7.7 3.6 - 11.0 K/uL   RBC 4.14 3.80 - 5.20 MIL/uL   Hemoglobin 12.6 12.0 - 16.0 g/dL   HCT 36.6 35.0 - 47.0 %   MCV 88.6 80.0 - 100.0 fL   MCH 30.5 26.0 - 34.0 pg   MCHC 34.4 32.0 - 36.0 g/dL   RDW 14.4 11.5 - 14.5 %   Platelets 190 150 - 440 K/uL  Comprehensive metabolic panel     Status: Abnormal   Collection Time: 12/31/16  2:56 AM  Result Value Ref Range   Sodium 138 135 - 145 mmol/L   Potassium 2.3 (LL) 3.5 - 5.1 mmol/L    Comment: CRITICAL RESULT CALLED TO, READ BACK BY AND VERIFIED WITH RAQUEL DAVID ON 12/31/16 AT 0345 BY TLB    Chloride 96 (L) 101 - 111 mmol/L  CO2 30 22 - 32 mmol/L   Glucose, Bld 132 (H) 65 - 99 mg/dL   BUN 21 (H) 6 - 20 mg/dL   Creatinine, Ser 1.24 (H) 0.44 - 1.00 mg/dL   Calcium 9.2 8.9 - 10.3 mg/dL   Total Protein 7.4 6.5 - 8.1 g/dL   Albumin 3.5 3.5 - 5.0 g/dL   AST 42 (H) 15 - 41 U/L   ALT 21 14 - 54 U/L   Alkaline Phosphatase 71 38 - 126 U/L   Total Bilirubin 0.7 0.3 - 1.2 mg/dL   GFR calc non Af Amer 48 (L) >60 mL/min   GFR calc Af Amer 55 (L) >60 mL/min    Comment: (NOTE) The eGFR has been calculated using the CKD EPI equation. This calculation has not been validated in all clinical situations. eGFR's persistently <60 mL/min signify possible Chronic Kidney Disease.    Anion gap 12 5 - 15  Lipase, blood     Status: None   Collection Time: 12/31/16  2:56 AM  Result Value Ref Range   Lipase 29 11 - 51 U/L  Troponin I     Status: None   Collection  Time: 12/31/16  2:56 AM  Result Value Ref Range   Troponin I <0.03 <0.03 ng/mL  Urinalysis, Complete w Microscopic     Status: Abnormal   Collection Time: 12/31/16  3:57 AM  Result Value Ref Range   Color, Urine YELLOW (A) YELLOW   APPearance CLEAR (A) CLEAR   Specific Gravity, Urine 1.016 1.005 - 1.030   pH 5.0 5.0 - 8.0   Glucose, UA NEGATIVE NEGATIVE mg/dL   Hgb urine dipstick NEGATIVE NEGATIVE   Bilirubin Urine NEGATIVE NEGATIVE   Ketones, ur NEGATIVE NEGATIVE mg/dL   Protein, ur NEGATIVE NEGATIVE mg/dL   Nitrite NEGATIVE NEGATIVE   Leukocytes, UA NEGATIVE NEGATIVE   RBC / HPF 0-5 0 - 5 RBC/hpf   WBC, UA 0-5 0 - 5 WBC/hpf   Bacteria, UA NONE SEEN NONE SEEN   Squamous Epithelial / LPF 0-5 (A) NONE SEEN   Mucous PRESENT    Hyaline Casts, UA PRESENT   Influenza panel by PCR (type A & B)     Status: Abnormal   Collection Time: 12/31/16  3:57 AM  Result Value Ref Range   Influenza A By PCR POSITIVE (A) NEGATIVE   Influenza B By PCR NEGATIVE NEGATIVE    Comment: (NOTE) The Xpert Xpress Flu assay is intended as an aid in the diagnosis of  influenza and should not be used as a sole basis for treatment.  This  assay is FDA approved for nasopharyngeal swab specimens only. Nasal  washings and aspirates are unacceptable for Xpert Xpress Flu testing.    Dg Chest 2 View  Result Date: 12/31/2016 CLINICAL DATA:  56 y/o F; left-sided chest pain and shortness of breath for 1 week. EXAM: CHEST  2 VIEW COMPARISON:  11/19/2016 chest radiograph FINDINGS: The heart size and mediastinal contours are within normal limits and stable. Linear opacities at lung bases probably represent atelectasis. No focal consolidation. The visualized skeletal structures are unremarkable. IMPRESSION: Minor bibasilar atelectasis.  No focal consolidation. Electronically Signed   By: Kristine Garbe M.D.   On: 12/31/2016 03:19    Review of Systems  Constitutional: Positive for chills. Negative for fever  (subjective).  HENT: Negative for sore throat and tinnitus.   Eyes: Negative for blurred vision and redness.  Respiratory: Positive for shortness of breath. Negative for cough.  Cardiovascular: Negative for chest pain, palpitations, orthopnea and PND.  Gastrointestinal: Negative for abdominal pain, diarrhea, nausea and vomiting.  Genitourinary: Negative for dysuria, frequency and urgency.  Musculoskeletal: Negative for joint pain and myalgias.  Skin: Negative for rash.       No lesions  Neurological: Negative for speech change, focal weakness and weakness.  Endo/Heme/Allergies: Does not bruise/bleed easily.       No temperature intolerance  Psychiatric/Behavioral: Negative for depression and suicidal ideas.    Blood pressure 128/78, pulse 67, temperature 98.9 F (37.2 C), temperature source Oral, resp. rate 20, height 5' 6"  (1.676 m), weight 90.7 kg (200 lb), SpO2 93 %. Physical Exam  Vitals reviewed. Constitutional: She is oriented to person, place, and time. She appears well-developed and well-nourished. No distress.  HENT:  Head: Normocephalic and atraumatic.  Mouth/Throat: Oropharynx is clear and moist.  Eyes: Conjunctivae and EOM are normal. Pupils are equal, round, and reactive to light. No scleral icterus.  Neck: Normal range of motion. Neck supple. No JVD present. No tracheal deviation present. No thyromegaly present.  Cardiovascular: Normal rate, regular rhythm and normal heart sounds.  Exam reveals no gallop and no friction rub.   No murmur heard. Respiratory: Effort normal. She has rhonchi in the right lower field and the left lower field.  GI: Soft. Bowel sounds are normal. She exhibits no distension. There is no tenderness.  Lymphadenopathy:    She has no cervical adenopathy.  Neurological: She is alert and oriented to person, place, and time. No cranial nerve deficit. She exhibits normal muscle tone.  Skin: Skin is warm and dry.  Psychiatric: She has a normal mood and  affect. Judgment and thought content normal.     Assessment/Plan This is a 56 year old female admitted for hypoxia. 1. Hypoxia: Secondary to viral pneumonia. Supportive care. Wean supplemental O2 as oriented. 2. Influenza:type A; continue Tamiflu 3. Acute kidney injury: Secondary to dehydration due to decreased by mouth intake. Hydrate with intravenous fluid and avoid nephrotoxic agents. 4. Hypokalemia: Replete potassium 5. DVT prophylaxis: Heparin 6. GI prophylaxis: None neck signed the patient is a full code. Time spent on admission orders and patient care approximately 45 minutes  Harrie Foreman, MD 12/31/2016, 8:01 AM

## 2016-12-31 NOTE — ED Triage Notes (Signed)
Pt in with co left sided chest pain and shob x 1 week. Today started having upper abd pain with greenish colored diarrhea x 1 week.

## 2017-01-01 LAB — BASIC METABOLIC PANEL
Anion gap: 9 (ref 5–15)
BUN: 21 mg/dL — AB (ref 6–20)
CALCIUM: 9.5 mg/dL (ref 8.9–10.3)
CO2: 31 mmol/L (ref 22–32)
CREATININE: 0.9 mg/dL (ref 0.44–1.00)
Chloride: 99 mmol/L — ABNORMAL LOW (ref 101–111)
GFR calc Af Amer: >60 mL/min (ref >60)
GLUCOSE: 111 mg/dL — AB (ref 65–99)
Potassium: 3.7 mmol/L (ref 3.5–5.1)
Sodium: 139 mmol/L (ref 135–145)

## 2017-01-01 MED ORDER — OSELTAMIVIR PHOSPHATE 30 MG PO CAPS: 30.0000 mg | ORAL_CAPSULE | Freq: Two times a day (BID) | ORAL | 0 refills | Status: DC

## 2017-01-01 MED ORDER — PREDNISONE 50 MG PO TABS: 50.0000 mg | ORAL_TABLET | Freq: Every day | ORAL | 0 refills | Status: DC

## 2017-01-01 MED ORDER — OSELTAMIVIR PHOSPHATE 75 MG PO CAPS
75.0000 mg | ORAL_CAPSULE | Freq: Two times a day (BID) | ORAL | Status: DC
  Administered 2017-01-01 – 2017-01-02 (×2): 75 mg via ORAL
  Filled 2017-01-01 (×2): qty 1

## 2017-01-01 NOTE — Progress Notes (Signed)
[facility] [place] NAME: Woodrow Natividad    MR#:  [id number]  DATE OF BIRTH:  04-21-1961  SUBJECTIVE:  CHIEF COMPLAINT:   Chief Complaint  Patient presents with  . Shortness of Breath   Still has wheezing and cough  REVIEW OF SYSTEMS:    Review of Systems  Constitutional: Positive for malaise/fatigue. Negative for chills and fever.  HENT: Negative for sore throat.   Eyes: Negative for blurred vision, double vision and pain.  Respiratory: Positive for cough, sputum production and wheezing. Negative for hemoptysis and shortness of breath.   Cardiovascular: Negative for chest pain, palpitations, orthopnea and leg swelling.  Gastrointestinal: Negative for abdominal pain, constipation, diarrhea, heartburn, nausea and vomiting.  Genitourinary: Negative for dysuria and hematuria.  Musculoskeletal: Negative for back pain and joint pain.  Skin: Negative for rash.  Neurological: Negative for sensory change, speech change, focal weakness and headaches.  Endo/Heme/Allergies: Does not bruise/bleed easily.  Psychiatric/Behavioral: Negative for depression. The patient is not nervous/anxious.     DRUG ALLERGIES:   Allergies  Allergen Reactions  . Nystatin Other (See Comments)    Reaction: unknown    VITALS:  Blood pressure 136/86, pulse (!) 40, temperature 98.2 F (36.8 C), temperature source Oral, resp. rate 20, height 5\' 6"  (1.676 m), weight 92.3 kg (203 lb 6 oz), SpO2 98 %.  PHYSICAL EXAMINATION:   Physical Exam  GENERAL:  56 y.o.-year-old patient lying in the bed with conversational dyspnea EYES: Pupils equal, round, reactive to light and accommodation. No scleral icterus. Extraocular muscles intact.  HEENT: Head atraumatic, normocephalic. Oropharynx and nasopharynx clear.  NECK:  Supple, no jugular venous distention. No thyroid enlargement, no tenderness.  LUNGS: BIlateral wheezing. CARDIOVASCULAR: S1, S2 normal. No murmurs, rubs, or  gallops.  ABDOMEN: Soft, nontender, nondistended. Bowel sounds present. No organomegaly or mass.  EXTREMITIES: No cyanosis, clubbing or edema b/l.    NEUROLOGIC: Cranial nerves II through XII are intact. No focal Motor or sensory deficits b/l.   PSYCHIATRIC: The patient is alert and oriented x 3.  SKIN: No obvious rash, lesion, or ulcer.   LABORATORY PANEL:   CBC  Recent Labs Lab 12/31/16 0256  WBC 7.7  HGB 12.6  HCT 36.6  PLT 190   ------------------------------------------------------------------------------------------------------------------ Chemistries   Recent Labs Lab 12/31/16 0256 01/01/17 0819  NA 138 139  K 2.3* 3.7  CL 96* 99*  CO2 30 31  GLUCOSE 132* 111*  BUN 21* 21*  CREATININE 1.24* 0.90  CALCIUM 9.2 9.5  AST 42*  --   ALT 21  --   ALKPHOS 71  --   BILITOT 0.7  --    ------------------------------------------------------------------------------------------------------------------  Cardiac Enzymes  Recent Labs Lab 12/31/16 0256  TROPONINI <0.03   ------------------------------------------------------------------------------------------------------------------  RADIOLOGY:  Dg Chest 2 View  Result Date: 12/31/2016 CLINICAL DATA:  56 y/o F; left-sided chest pain and shortness of breath for 1 week. EXAM: CHEST  2 VIEW COMPARISON:  11/19/2016 chest radiograph FINDINGS: The heart size and mediastinal contours are within normal limits and stable. Linear opacities at lung bases probably represent atelectasis. No focal consolidation. The visualized skeletal structures are unremarkable. IMPRESSION: Minor bibasilar atelectasis.  No focal consolidation. Electronically Signed   By: Kristine Garbe M.D.   On: 12/31/2016 03:19     ASSESSMENT AND PLAN:   * Influenza A with Acute bronchitis Acute hypoxic resp failure resolved -IV steroids, Antibiotics - Scheduled Nebulizers - Inhalers -Wean O2 as tolerated - Consult  pulmonary if no  improvement  * HTN Home meds  * DM SSI   All the records are reviewed and case discussed with Care Management/Social Workerr. Management plans discussed with the patient, family and they are in agreement.  CODE STATUS: FULL CODE  DVT Prophylaxis: SCDs  TOTAL TIME TAKING CARE OF THIS PATIENT: 30 minutes.   POSSIBLE D/C IN 1-2 DAYS, DEPENDING ON CLINICAL CONDITION.  Hillary Bow R M.D on 01/01/2017 at 8:39 PM  Between 7am to 6pm - Pager - [phone]  After 6pm go to [email or web address] - password [email or web address] [facility]  Office  [phone]  CC: Primary care physician; PROVIDER NOT IN SYSTEM  Note: This dictation was prepared with Dragon dictation along with smaller phrase technology. Any transcriptional errors that result from this process are unintentional.

## 2017-01-01 NOTE — Care Management Obs Status (Signed)
[facility] NOTIFICATION   Patient Details  Name: Jennifer Weiss MRN: [id number] Date of Birth: 04-12-1961   Medicare Observation Status Notification Given:  Yes    BALEY ALTHEIDE, RN 01/01/2017, 9:53 AM

## 2017-01-01 NOTE — Discharge Instructions (Signed)
Resume diet and activity as before ° ° °

## 2017-01-02 LAB — HEMOGLOBIN A1C
Hgb A1c MFr Bld: 6.3 % — ABNORMAL HIGH (ref 4.8–5.6)
Mean Plasma Glucose: 134 mg/dL

## 2017-01-02 NOTE — Progress Notes (Signed)
Discharge instructions given and went over with patient at bedside. Prescriptions reviewed. All questions answered. Patient to be discharged home with daughter.   Awaiting transportation. Daughter was here but was not willing to wait for discharge instructions to be reviewed with patient.   Madlyn Frankel, RN

## 2017-01-02 NOTE — Progress Notes (Signed)
Patient discharged to home with daughter via wheelchair by nursing staff. Madlyn Frankel, RN

## 2017-01-06 NOTE — Discharge Summary (Signed)
[facility] [place] NAME: Jennifer Weiss    MR#:  [id number]  DATE OF BIRTH:  [birth year]  DATE OF ADMISSION:  12/31/2016 ADMITTING PHYSICIAN: Harrie Foreman, MD  DATE OF DISCHARGE: 01/02/2017 11:20 AM  PRIMARY CARE PHYSICIAN: PROVIDER NOT IN SYSTEM   ADMISSION DIAGNOSIS:  Hypoxia [R09.02] Influenza [J11.1]  DISCHARGE DIAGNOSIS:  Active Problems:   Hypoxia   SECONDARY DIAGNOSIS:   Past Medical History:  Diagnosis Date  . Arthritis   . Asthma   . Cancer ([place])    ovarian  . Diabetes mellitus without complication ([place])   . Hypertension      ADMITTING HISTORY  Chief Complaint: Shortness of breath HPI: The patient with past medical history of ovarian cancer asthma asthma and diabetes presents to the emergency department complaining of shortness of breath. She states that she is to subjective fevers with aches and chills. The patient denies chest pain, nausea, vomiting or diaphoresis. Laboratory evaluation revealed the patient is positive for influenza. Attempted cannulation resulted and mild hypoxia as well as tachypnea. She was also found to be hypokalemic with mild acute kidney injury which prompted the emergency department staff to call hospitalist service for admission.  HOSPITAL COURSE:   * Influenza a with acute bronchitis * Acute hypoxic respiratory failure * Hypertension * Diabetes mellitus  Patient was treated with Tamiflu, steroids, nebulizers. Patient was off oxygen by the day of discharge and felt back to normal. She is being discharged home to finish her 5 day course of Tamiflu.  CONSULTS OBTAINED:    DRUG ALLERGIES:   Allergies  Allergen Reactions  . Nystatin Other (See Comments)    Reaction: unknown    DISCHARGE MEDICATIONS:   Discharge Medication List as of 01/02/2017 10:00 AM    START taking these medications   Details  oseltamivir (TAMIFLU) 30 MG capsule Take 1 capsule (30 mg total) by mouth 2 (two)  times daily., Starting Thu 01/01/2017, Normal    predniSONE (DELTASONE) 50 MG tablet Take 1 tablet (50 mg total) by mouth daily with breakfast., Starting Thu 01/01/2017, Normal      CONTINUE these medications which have NOT CHANGED   Details  acyclovir (ZOVIRAX) 800 MG tablet Take 800 mg by mouth daily., Historical Med    albuterol (PROVENTIL HFA;VENTOLIN HFA) 108 (90 Base) MCG/ACT inhaler Inhale 2 puffs into the lungs every 4 (four) hours as needed for wheezing or shortness of breath., Historical Med    atenolol-chlorthalidone (TENORETIC) 100-25 MG tablet Take 1 tablet by mouth daily., Historical Med    atorvastatin (LIPITOR) 40 MG tablet Take 40 mg by mouth daily., Historical Med    FLUoxetine (PROZAC) 10 MG capsule Take 10 mg by mouth daily., Historical Med    fluticasone (FLOVENT HFA) 110 MCG/ACT inhaler Inhale 2 puffs into the lungs 2 (two) times daily., Historical Med    gabapentin (NEURONTIN) 300 MG capsule Take 900 mg by mouth 3 (three) times daily., Historical Med    ipratropium (ATROVENT HFA) 17 MCG/ACT inhaler Inhale 2 puffs into the lungs 2 (two) times daily., Historical Med    metFORMIN (GLUCOPHAGE) 500 MG tablet Take 500 mg by mouth 2 (two) times daily with a meal., Historical Med        Today   VITAL SIGNS:  Blood pressure 139/84, pulse 65, temperature 97.8 F (36.6 C), temperature source Oral, resp. rate 20, height 5\' 6"  (1.676 m), weight 98.2 kg (216 lb 6.4 oz), SpO2 99 %.  I/O:  No intake or output data in the 24 hours ending 01/06/17 1545  PHYSICAL EXAMINATION:  Physical Exam  GENERAL:  56 y.o.-year-old patient lying in the bed with no acute distress.  LUNGS: Normal breath sounds bilaterally, no wheezing, rales,rhonchi or crepitation. No use of accessory muscles of respiration.  CARDIOVASCULAR: S1, S2 normal. No murmurs, rubs, or gallops.  ABDOMEN: Soft, non-tender, non-distended. Bowel sounds present. No organomegaly or mass.  NEUROLOGIC: Moves all 4  extremities. PSYCHIATRIC: The patient is alert and oriented x 3.  SKIN: No obvious rash, lesion, or ulcer.   DATA REVIEW:   CBC  Recent Labs Lab 12/31/16 0256  WBC 7.7  HGB 12.6  HCT 36.6  PLT 190    Chemistries   Recent Labs Lab 12/31/16 0256 01/01/17 0819  NA 138 139  K 2.3* 3.7  CL 96* 99*  CO2 30 31  GLUCOSE 132* 111*  BUN 21* 21*  CREATININE 1.24* 0.90  CALCIUM 9.2 9.5  AST 42*  --   ALT 21  --   ALKPHOS 71  --   BILITOT 0.7  --     Cardiac Enzymes  Recent Labs Lab 12/31/16 0256  TROPONINI <0.03    Microbiology Results  No results found for this or any previous visit.  RADIOLOGY:  No results found.  Follow up with PCP in 1 week.  Management plans discussed with the patient, family and they are in agreement.  CODE STATUS:  Code Status History    Date Active Date Inactive Code Status Order ID Comments User Context   12/31/2016  7:44 AM 01/02/2017  3:19 PM Full Code [id number]  Harrie Foreman, MD Inpatient      TOTAL TIME TAKING CARE OF THIS PATIENT ON DAY OF DISCHARGE: more than 30 minutes.   Hillary Bow R M.D on 01/06/2017 at 3:45 PM  Between 7am to 6pm - Pager - [phone]  After 6pm go to [email or web address] - password [email or web address] [facility]  Office  [phone]  CC: Primary care physician; PROVIDER NOT IN SYSTEM  Note: This dictation was prepared with Dragon dictation along with smaller phrase technology. Any transcriptional errors that result from this process are unintentional.

## 2017-02-15 ENCOUNTER — Emergency Department: Payer: Medicare Other

## 2017-02-15 ENCOUNTER — Emergency Department
Admission: EM | Admit: 2017-02-15 | Discharge: 2017-02-15 | Disposition: A | Discharge status: Home / Self Care | Payer: Medicare Other | Attending: Emergency Medicine | Admitting: Emergency Medicine

## 2017-02-15 ENCOUNTER — Encounter: Payer: Self-pay | Admitting: Intensive Care

## 2017-02-15 DIAGNOSIS — R569 Unspecified convulsions: Secondary | ICD-10-CM

## 2017-02-15 DIAGNOSIS — E119 Type 2 diabetes mellitus without complications: Secondary | ICD-10-CM | POA: Insufficient documentation

## 2017-02-15 DIAGNOSIS — M79622 Pain in left upper arm: Secondary | ICD-10-CM | POA: Diagnosis not present

## 2017-02-15 DIAGNOSIS — F17210 Nicotine dependence, cigarettes, uncomplicated: Secondary | ICD-10-CM | POA: Insufficient documentation

## 2017-02-15 DIAGNOSIS — Z8543 Personal history of malignant neoplasm of ovary: Secondary | ICD-10-CM | POA: Insufficient documentation

## 2017-02-15 DIAGNOSIS — M25551 Pain in right hip: Secondary | ICD-10-CM | POA: Insufficient documentation

## 2017-02-15 DIAGNOSIS — J45909 Unspecified asthma, uncomplicated: Secondary | ICD-10-CM | POA: Diagnosis not present

## 2017-02-15 DIAGNOSIS — G40909 Epilepsy, unspecified, not intractable, without status epilepticus: Secondary | ICD-10-CM | POA: Insufficient documentation

## 2017-02-15 DIAGNOSIS — Z7984 Long term (current) use of oral hypoglycemic drugs: Secondary | ICD-10-CM | POA: Diagnosis not present

## 2017-02-15 DIAGNOSIS — Z79899 Other long term (current) drug therapy: Secondary | ICD-10-CM | POA: Insufficient documentation

## 2017-02-15 DIAGNOSIS — M545 Low back pain: Secondary | ICD-10-CM | POA: Insufficient documentation

## 2017-02-15 HISTORY — DX: Unspecified convulsions: R56.9

## 2017-02-15 LAB — URINALYSIS, COMPLETE (UACMP) WITH MICROSCOPIC
Bilirubin Urine: NEGATIVE
Glucose, UA: NEGATIVE mg/dL
Hgb urine dipstick: NEGATIVE
KETONES UR: NEGATIVE mg/dL
Leukocytes, UA: NEGATIVE
Nitrite: NEGATIVE
PH: 6 (ref 5.0–8.0)
PROTEIN: NEGATIVE mg/dL
Specific Gravity, Urine: 1.01 (ref 1.005–1.030)

## 2017-02-15 LAB — CBC WITH DIFFERENTIAL/PLATELET
BASOS ABS: 0.1 10*3/uL (ref 0–0.1)
BASOS PCT: 1 %
Eosinophils Absolute: 0.2 10*3/uL (ref 0–0.7)
Eosinophils Relative: 2 %
HEMATOCRIT: 36.7 % (ref 35.0–47.0)
HEMOGLOBIN: 12.2 g/dL (ref 12.0–16.0)
Lymphocytes Relative: 48 %
Lymphs Abs: 4.5 10*3/uL — ABNORMAL HIGH (ref 1.0–3.6)
MCH: 30.3 pg (ref 26.0–34.0)
MCHC: 33.3 g/dL (ref 32.0–36.0)
MCV: 90.9 fL (ref 80.0–100.0)
Monocytes Absolute: 0.8 10*3/uL (ref 0.2–0.9)
Monocytes Relative: 8 %
NEUTROS ABS: 3.8 10*3/uL (ref 1.4–6.5)
NEUTROS PCT: 41 %
Platelets: 155 10*3/uL (ref 150–440)
RBC: 4.04 MIL/uL (ref 3.80–5.20)
RDW: 15.6 % — ABNORMAL HIGH (ref 11.5–14.5)
WBC: 9.3 10*3/uL (ref 3.6–11.0)

## 2017-02-15 LAB — BASIC METABOLIC PANEL
ANION GAP: 9 (ref 5–15)
BUN: 16 mg/dL (ref 6–20)
CALCIUM: 9 mg/dL (ref 8.9–10.3)
CO2: 30 mmol/L (ref 22–32)
Chloride: 103 mmol/L (ref 101–111)
Creatinine, Ser: 0.87 mg/dL (ref 0.44–1.00)
Glucose, Bld: 87 mg/dL (ref 65–99)
POTASSIUM: 3.2 mmol/L — AB (ref 3.5–5.1)
Sodium: 142 mmol/L (ref 135–145)

## 2017-02-15 LAB — GLUCOSE, CAPILLARY: GLUCOSE-CAPILLARY: 68 mg/dL (ref 65–99)

## 2017-02-15 MED ORDER — LEVETIRACETAM 750 MG PO TABS
750.0000 mg | ORAL_TABLET | Freq: Once | ORAL | Status: AC
  Administered 2017-02-15: 750 mg via ORAL
  Filled 2017-02-15: qty 1

## 2017-02-15 MED ORDER — POTASSIUM CHLORIDE CRYS ER 20 MEQ PO TBCR
40.0000 meq | EXTENDED_RELEASE_TABLET | Freq: Once | ORAL | Status: AC
  Administered 2017-02-15: 40 meq via ORAL
  Filled 2017-02-15: qty 2

## 2017-02-15 MED ORDER — MORPHINE SULFATE (PF) 4 MG/ML IV SOLN
4.0000 mg | Freq: Once | INTRAVENOUS | Status: AC
Start: 2017-02-15 — End: 2017-02-15
  Administered 2017-02-15: 4 mg via INTRAMUSCULAR
  Filled 2017-02-15: qty 1

## 2017-02-15 MED ORDER — LEVETIRACETAM 750 MG PO TABS: 750.0000 mg | ORAL_TABLET | Freq: Two times a day (BID) | ORAL | 0 refills | Status: DC

## 2017-02-15 NOTE — ED Notes (Addendum)
Patient transferred to [facility] and [facility]

## 2017-02-15 NOTE — Discharge Instructions (Signed)
You have been seen in the emergency department today for a likely seizure.  Your workup today including labs are within normal limits.  Please follow up with your doctor as soon as possible regarding today's emergency department visit and your likely seizure.  In addition, please start taking a baby aspirin (81mg ) by mouth daily and make sure to setup follow-up with NEUROLOGY as soon as possible, and your primary care doctor this week.  You will also need to follow up with a neurologist as soon as possible, please call for appointment.  If you have been prescribed a medication for your seizures, please take this medication as prescribed.  As we have discussed it is very important that you DO NOT drive until you have been seen and cleared by your neurologist.  Please drink plenty of fluids, get plenty of sleep and avoid any alcohol or drug use.  Return to the emergency department if you have any further seizures, develop any weakness/numbness of any arm/leg, confusion, slurred speech, or sudden/severe headache.

## 2017-02-15 NOTE — ED Provider Notes (Signed)
[facility] Emergency Department Provider Note  ____________________________________________   First MD Initiated Contact with Patient 02/15/17 1430     (approximate)  I have reviewed the triage vital signs and the nursing notes.   HISTORY  Chief Complaint Seizures   HPI Jennifer Weiss is a 56 y.o. female reports a previous history of seizures, but has not been on seizure medicine for several years.  Also history of previous ovarian cancer, and diabetes  Patient was at church today, she was sitting and then the next thing she remembers is awakening thereafter. She does believe she had a seizure, was told that she had a seizure, and EMS reported that the patient had generalized seizure and shaking activity that was reported for about 1 minute. She denies any head injury, nausea vomiting, chest pain or shortness of breath. She does report her left shoulder and upper arm feels sore, and also at her lower back feels sore after.  No numbness or weakness. Her speech is off normally, she has a "stuttering" which is been a lifelong issue for her without change today. No numbness or weakness in arms or legs. No facial droop. No other concerns.  Denies any recent illness.   Past Medical History:  Diagnosis Date  . Arthritis   . Asthma   . Cancer ([place])    ovarian  . Diabetes mellitus without complication ([place])   . Hypertension   . Seizures [facility])     Patient Active Problem List   Diagnosis Date Noted  . Hypoxia 12/31/2016    Past Surgical History:  Procedure Laterality Date  . none      Prior to Admission medications   Medication Sig Start Date End Date Taking? Authorizing Provider  acyclovir (ZOVIRAX) 800 MG tablet Take 800 mg by mouth daily.    Historical Provider, MD  albuterol (PROVENTIL HFA;VENTOLIN HFA) 108 (90 Base) MCG/ACT inhaler Inhale 2 puffs into the lungs every 4 (four) hours as needed for wheezing or shortness of breath.    Historical  Provider, MD  atenolol-chlorthalidone (TENORETIC) 100-25 MG tablet Take 1 tablet by mouth daily.    Historical Provider, MD  atorvastatin (LIPITOR) 40 MG tablet Take 40 mg by mouth daily.    Historical Provider, MD  FLUoxetine (PROZAC) 10 MG capsule Take 10 mg by mouth daily.    Historical Provider, MD  fluticasone (FLOVENT HFA) 110 MCG/ACT inhaler Inhale 2 puffs into the lungs 2 (two) times daily.    Historical Provider, MD  gabapentin (NEURONTIN) 300 MG capsule Take 900 mg by mouth 3 (three) times daily.    Historical Provider, MD  ipratropium (ATROVENT HFA) 17 MCG/ACT inhaler Inhale 2 puffs into the lungs 2 (two) times daily.    Historical Provider, MD  levETIRAcetam (KEPPRA) 750 MG tablet Take 1 tablet (750 mg total) by mouth 2 (two) times daily. 02/15/17   Delman Kitten, MD  metFORMIN (GLUCOPHAGE) 500 MG tablet Take 500 mg by mouth 2 (two) times daily with a meal.    Historical Provider, MD  oseltamivir (TAMIFLU) 30 MG capsule Take 1 capsule (30 mg total) by mouth 2 (two) times daily. 01/01/17   Hillary Bow, MD  predniSONE (DELTASONE) 50 MG tablet Take 1 tablet (50 mg total) by mouth daily with breakfast. 01/01/17   Hillary Bow, MD    Allergies Nystatin  Family History  Problem Relation Age of Onset  . Diabetes Mellitus II Mother     Social History Social History  Substance Use Topics  .  Smoking status: Current Every Day Smoker    Packs/day: 0.50    Types: Cigarettes  . Smokeless tobacco: Never Used  . Alcohol use No    Review of Systems Constitutional: No fever/chills Eyes: No visual changes. ENT: No sore throat. Cardiovascular: Denies chest pain. Respiratory: Denies shortness of breath. Gastrointestinal: No abdominal pain.  No nausea, no vomiting.  No diarrhea.  No constipation. Genitourinary: Negative for dysuria. Musculoskeletal: For true lower back feels sore, also sore across the left shoulder but denies any inability to move it. No numbness tingling or weakness. Skin:  Negative for rash. Neurological: Negative for headaches, focal weakness or numbness. See the history of present illness  10-point ROS otherwise negative.  ____________________________________________   PHYSICAL EXAM:  VITAL SIGNS: ED Triage Vitals  Enc Vitals Group     BP 02/15/17 1318 139/79     Pulse Rate 02/15/17 1318 62     Resp 02/15/17 1318 (!) 22     Temp 02/15/17 1318 97.9 F (36.6 C)     Temp Source 02/15/17 1318 Oral     SpO2 02/15/17 1318 100 %     Weight 02/15/17 1310 200 lb (90.7 kg)     Height 02/15/17 1310 5\' 5"  (1.651 m)     Head Circumference --      Peak Flow --      Pain Score 02/15/17 1310 10     Pain Loc --      Pain Edu? --      Excl. in [place]? --     Constitutional: Alert and oriented. Well appearing and in no acute distress. Eyes: Conjunctivae are normal. PERRL. EOMI. Head: Atraumatic. Nose: No congestion/rhinnorhea. Mouth/Throat: Mucous membranes are moist.  Oropharynx non-erythematous. Neck: No stridor.  No cervical tenderness. Cardiovascular: Normal rate, regular rhythm. Grossly normal heart sounds.  Good peripheral circulation. Respiratory: Normal respiratory effort.  No retractions. Lungs CTAB. Gastrointestinal: Soft and nontender. No distention.  Musculoskeletal:   Lower Extremities  No edema. Normal DP/PT pulses bilateral with good cap refill.  Normal neuro-motor function lower extremities bilateral.  RIGHT Right lower extremity demonstrates normal strength, good use of all muscles. No edema bruising or contusions of the right hip, right knee, right ankle ever some mild tenderness without bruising edema or deformity over the right buttock. Full range of motion of the right lower extremity and she is walk without difficulty. No pain on axial loading. No evidence of trauma.  LEFT Left lower extremity demonstrates normal strength, good use of all muscles. No edema bruising or contusions of the hip,  knee, ankle. Full range of motion of the  left lower extremity without pain. No pain on axial loading. No evidence of trauma.  RIGHT Right upper extremity demonstrates normal strength, good use of all muscles. No edema bruising or contusions of the right shoulder/upper arm, right elbow, right forearm / hand. Full range of motion of the right right upper extremity without pain. No evidence of trauma. Strong radial pulse. Intact median/ulnar/radial neuro-muscular exam.  LEFT Left upper extremity demonstrates normal strength, good use of all muscles. No edema bruising or contusions of the left shoulder/upper arm, left elbow, left forearm / hand except for some mild tenderness over the left lateral shoulder without deformity. Full range of motion of the left  upper extremity with only mild pain over the lateral shoulder. No evidence of trauma. Strong radial pulse. Intact median/ulnar/radial neuro-muscular exam.   Neurologic:  Normal speech and language. No gross focal neurologic deficits are  appreciated. No gait instability. Skin:  Skin is warm, dry and intact. No rash noted. Psychiatric: Mood and affect are normal. Speech and behavior are normal.  ____________________________________________   LABS (all labs ordered are listed, but only abnormal results are displayed)  Labs Reviewed  URINALYSIS, COMPLETE (UACMP) WITH MICROSCOPIC - Abnormal; Notable for the following:       Result Value   Color, Urine STRAW (*)    APPearance CLEAR (*)    Bacteria, UA RARE (*)    Squamous Epithelial / LPF 0-5 (*)    All other components within normal limits  BASIC METABOLIC PANEL - Abnormal; Notable for the following:    Potassium 3.2 (*)    All other components within normal limits  CBC WITH DIFFERENTIAL/PLATELET - Abnormal; Notable for the following:    RDW 15.6 (*)    Lymphs Abs 4.5 (*)    All other components within normal limits  GLUCOSE, CAPILLARY  CBG MONITORING, ED  CBG MONITORING, ED    ____________________________________________  EKG  Reviewed and interpreted by me at 1520 The trigger a 55 Cure is 80 QTc 420 Sinus bradycardia, no acute ischemia noted. Compared with the patient's previous EKG from 11/19/2016, no significant changes noted ____________________________________________  RADIOLOGY  Dg Lumbar Spine 2-3 Views  Result Date: 02/15/2017 CLINICAL DATA:  Possible seizure.  Low back pain. EXAM: LUMBAR SPINE - 2-3 VIEW COMPARISON:  None. FINDINGS: There is transitional anatomy at L5-S1 with assimilation joints. There is anterolisthesis of L4 versus L5 which is borderline between grade 1 and 2. No other malalignment. Lower lumbar facet degenerative changes. 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 Anterolisthesis of L4 versus L5 as described above. Lower lumbar facet degenerative changes. Electronically Signed   By: Dorise Bullion III M.D   On: 02/15/2017 15:29   Ct Head Wo Contrast  Result Date: 02/15/2017 CLINICAL DATA:  Acute onset of grand mal seizure. Initial encounter. EXAM: CT HEAD WITHOUT CONTRAST TECHNIQUE: Contiguous axial images were obtained from the base of the skull through the vertex without intravenous contrast. COMPARISON:  None. FINDINGS: Brain: No evidence of acute infarction, hemorrhage, hydrocephalus, extra-axial collection or mass lesion/mass effect. Several foci of chronic encephalomalacia noted at the left frontal lobe, both superiorly and inferiorly, reflecting remote infarct. A chronic lacunar infarct is noted at the right cerebellar hemisphere. Mild subcortical Kernes matter change likely reflects small vessel ischemic microangiopathy. The brainstem and fourth ventricle are within normal limits. The basal ganglia are unremarkable in appearance. No mass effect or midline shift is seen. Vascular: No hyperdense vessel or unexpected calcification. Skull: There is no evidence of fracture; visualized osseous structures are  unremarkable in appearance. Sinuses/Orbits: The visualized portions of the orbits are within normal limits. The paranasal sinuses and mastoid air cells are well-aerated. Other: A metallic BB is noted embedded within the soft tissues near the calvarium at the anterior vertex. IMPRESSION: 1. No acute intracranial pathology seen on CT. 2. Several foci of chronic encephalomalacia at the left frontal lobe, both superiorly and inferiorly, reflecting remote infarct. 3. Chronic lacunar infarct at the right cerebellar hemisphere. 4. Mild small vessel ischemic microangiopathy. Electronically Signed   By: Garald Balding M.D.   On: 02/15/2017 17:09   Dg Humerus Left  Result Date: 02/15/2017 CLINICAL DATA:  Seizure.  Left upper arm pain.  Initial encounter. EXAM: LEFT HUMERUS - 2+ VIEW COMPARISON:  None. FINDINGS: The humerus appears intact without evidence of fracture or destructive osseous lesion. There is prominent marginal  osteophyte formation at the inferior aspect of the humeral head and inferior glenoid. No soft tissue abnormality is seen. IMPRESSION: 1. No evidence of acute osseous abnormality. 2. Prominent glenohumeral osteophytosis. Electronically Signed   By: Logan Bores M.D.   On: 02/15/2017 15:29   Dg Hip Unilat W Or Wo Pelvis 2-3 Views Right  Result Date: 02/15/2017 CLINICAL DATA:  [place] today.  Right hip pain. EXAM: DG HIP (WITH OR WITHOUT PELVIS) 2-3V RIGHT COMPARISON:  None. FINDINGS: Both hips are normally located. No acute hip fracture. No plain film findings for AVN. The pubic symphysis and SI joints are intact. No definite pelvic fractures. Advanced degenerative changes noted in the lower lumbar spine. IMPRESSION: No acute bony findings. Electronically Signed   By: Marijo Sanes M.D.   On: 02/15/2017 17:57    ____________________________________________   PROCEDURES  Procedure(s) performed: None  Procedures  Critical Care performed:  No  ____________________________________________   INITIAL IMPRESSION / ASSESSMENT AND PLAN / ED COURSE  Pertinent labs & imaging results that were available during my care of the patient were reviewed by me and considered in my medical decision making (see chart for details).  Patient presents for evaluation after witnessed episode that sounds most consistent with a seizure. She does carry a previous history of seizure, though it was several years ago. She is neurologically intact without deficit, and appears she likely had a slight postictal state. Patient patient does appear most consistent seizure, but she did not urinate or self, did not bite her tongue, but was witnessed to have generalized seizure-like activity for about 1 minute during church.  Clinical Course as of Feb 16 2155  Nancy Fetter Feb 15, 2017  1710 DG Lumbar Spine 2-3 Views [MQ]    Clinical Course User Index [MQ] Delman Kitten, MD   Reassuring examination, labs.   After imaging, thorough evaluation labs, case and care discussed with Dr. Irish Elders. Keppra 750 mg BID, follow-up outpatient with neurology. Patient agreeable to restarting baby aspirin daily as well, and follow up closely with neurology in the next 1-2 weeks following up with her primary care doctor in the interim.    ____________________________________________   FINAL CLINICAL IMPRESSION(S) / ED DIAGNOSES  Final diagnoses:  Seizure-like activity ([place])      NEW MEDICATIONS STARTED DURING THIS VISIT:  Discharge Medication List as of 02/15/2017  6:07 PM    START taking these medications   Details  levETIRAcetam (KEPPRA) 750 MG tablet Take 1 tablet (750 mg total) by mouth 2 (two) times daily., Starting Sun 02/15/2017, Print         Note:  This document was prepared using Dragon voice recognition software and may include unintentional dictation errors.     Delman Kitten, MD 02/15/17 2156

## 2017-02-15 NOTE — ED Notes (Signed)
Patient transported to X-ray 

## 2017-02-15 NOTE — ED Triage Notes (Signed)
Patient arrived by EMS from church for possible grand mal seizure. A member of the church reports witnessing patient have a grand mal seizure X min. Patient A&O x4 upon arrival to ER. Patients speech is stuttered but family reports this is baseline. Daughter reported patient has not had a seizure in years and no longer takes seizure medicine. Patient states she cannot read and her daughter usually helps her take her medicine but she stated "this morning I just took a handful of medicine so I do not know if what I took was right." Pt has chronic leg and back pain. EMS vitals 110/60b/p, 60sHR, 95blood sugar, 98%RA.

## 2017-03-29 IMAGING — CR DG HUMERUS 2V *L*
2 series · 3 of 3 positions shown · non-contrast
Comparison: None.

CLINICAL DATA: Seizure.  Left upper arm pain.  Initial encounter.

EXAM:
LEFT HUMERUS - 2+ VIEW

[Series 1: humerus ap · 0.14mm/px · 2 of 2 slices shown]
[im 1/2]
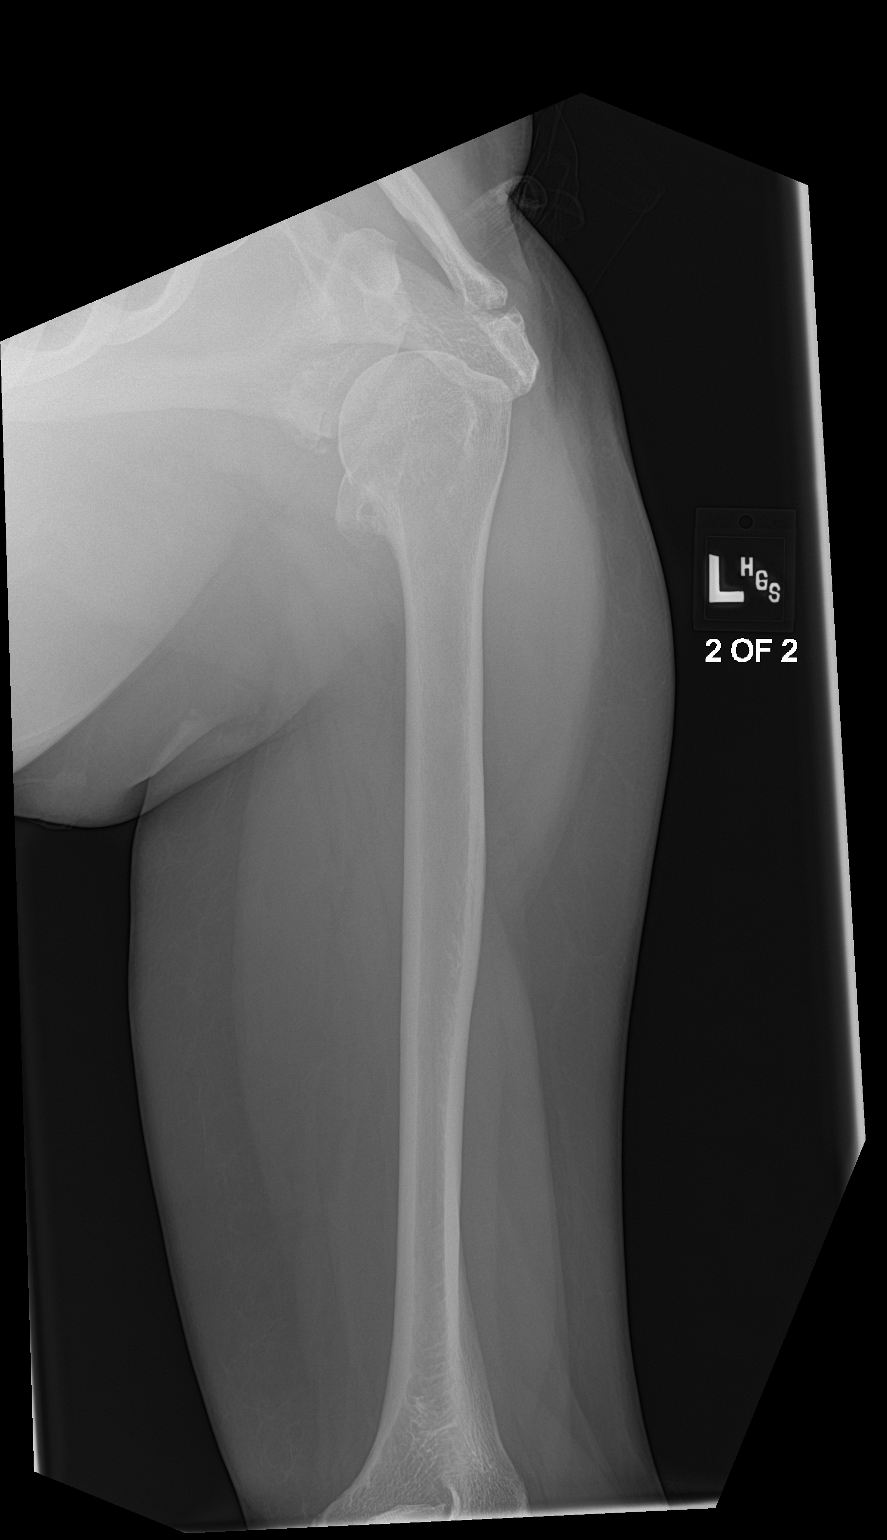
[im 2/2]
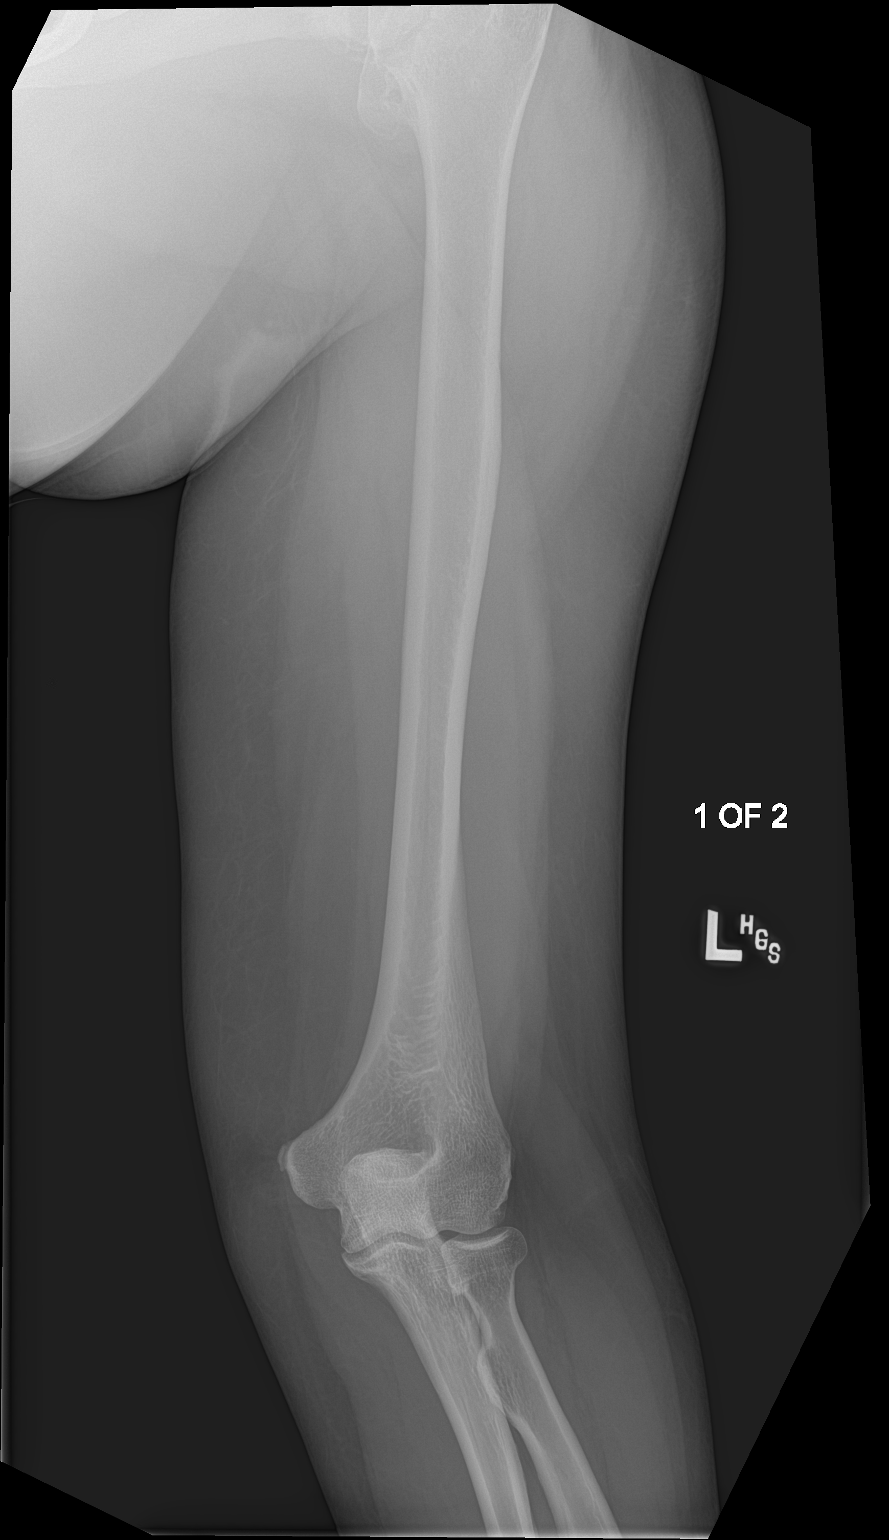

[humerus lat]
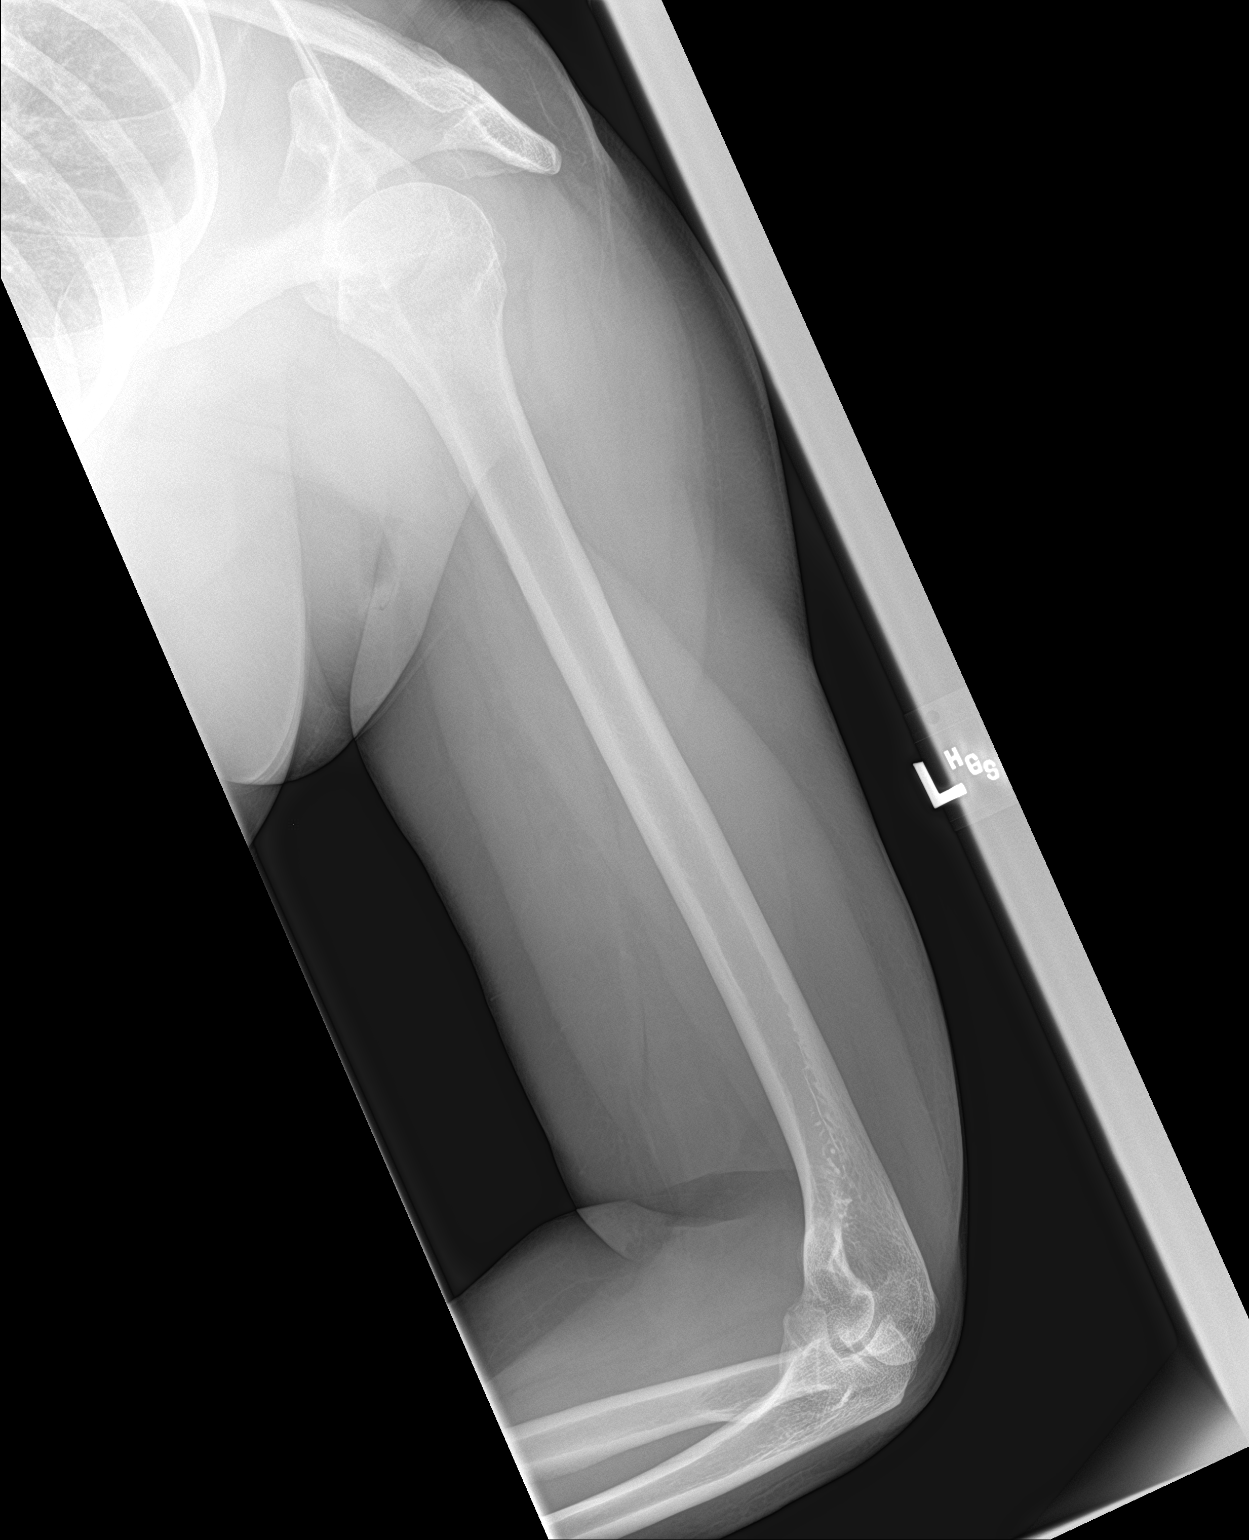

[3 of 3 positions shown; findings below may reference images not displayed]

FINDINGS: The humerus appears intact without evidence of fracture or
destructive osseous lesion. There is prominent marginal osteophyte
formation at the inferior aspect of the humeral head and inferior
glenoid. No soft tissue abnormality is seen.
IMPRESSION: 1. No evidence of acute osseous abnormality.
2. Prominent glenohumeral osteophytosis.

## 2017-03-29 IMAGING — CT CT HEAD W/O CM
4 series · 16 of 47 positions shown, 18 images · non-contrast
Comparison: None.

CLINICAL DATA: Acute onset of grand mal seizure. Initial encounter.

EXAM:
CT HEAD WITHOUT CONTRAST
TECHNIQUE: Contiguous axial images were obtained from the base of the skull
through the vertex without intravenous contrast.

[Series 2: head wo · axial · 0.39mm/px · z∈[-180,-75]mm · 7 of 29 slices shown, 9 images]
[im 4/29  brain]
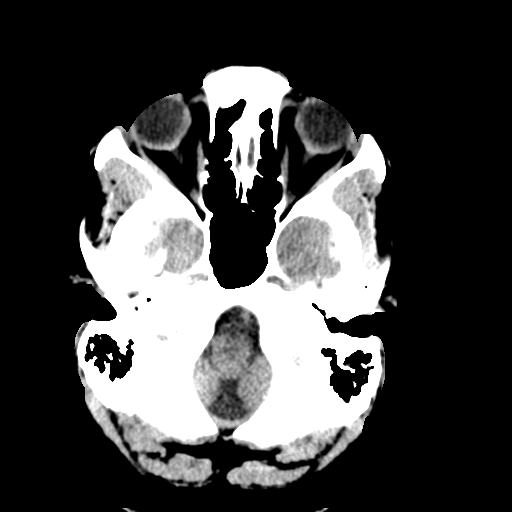
[im 4/29  bone]
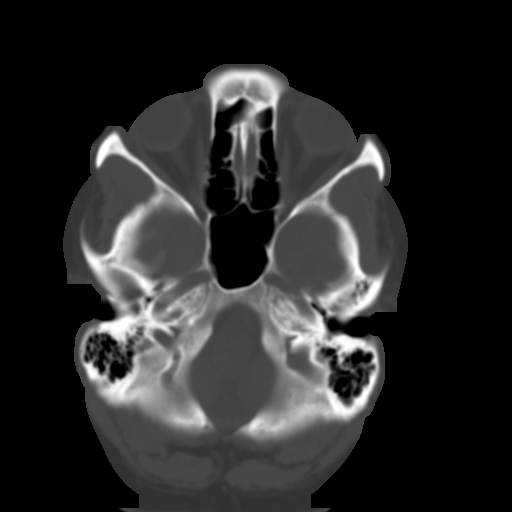
[im 8/29  brain]
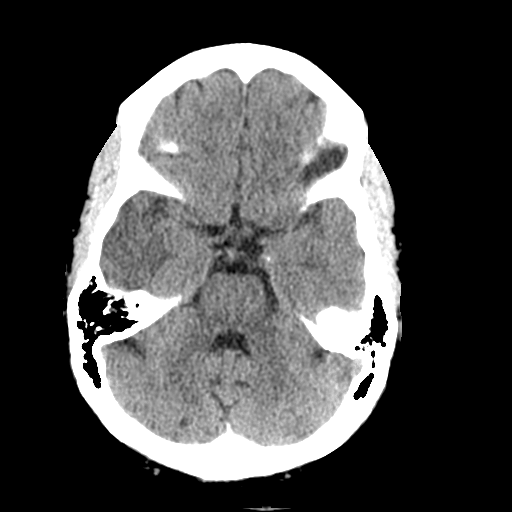
[im 11/29  brain]
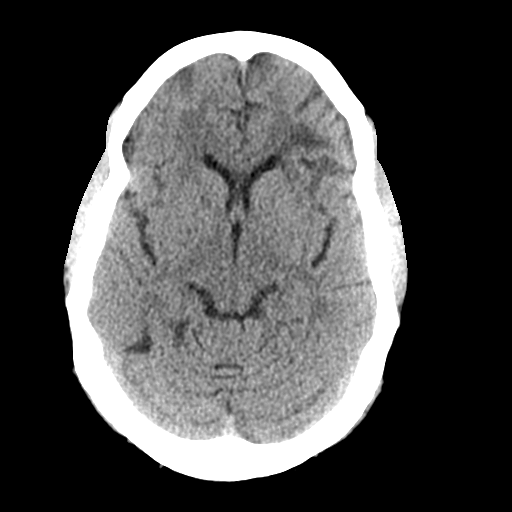
[im 15/29  brain]
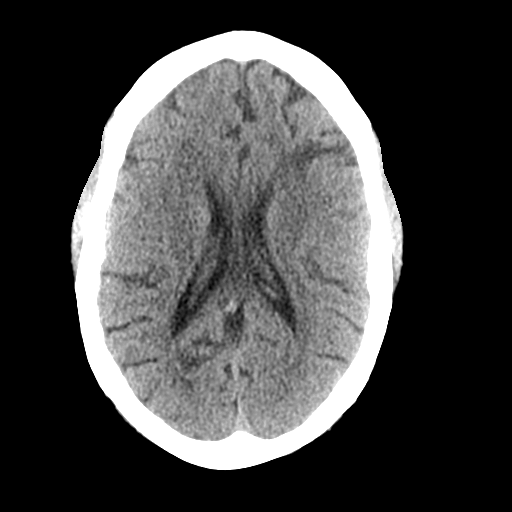
[im 18/29  brain]
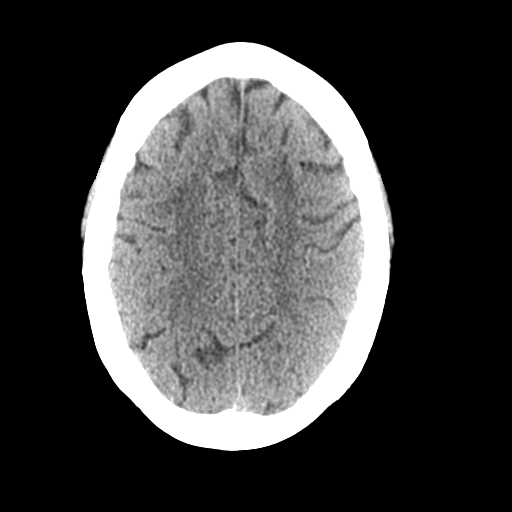
[im 18/29  bone]
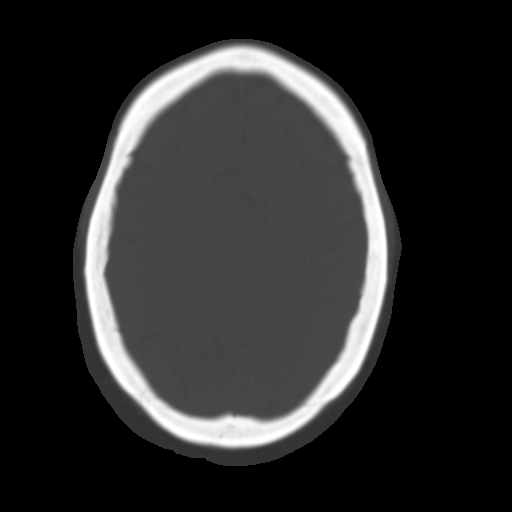
[im 22/29  brain]
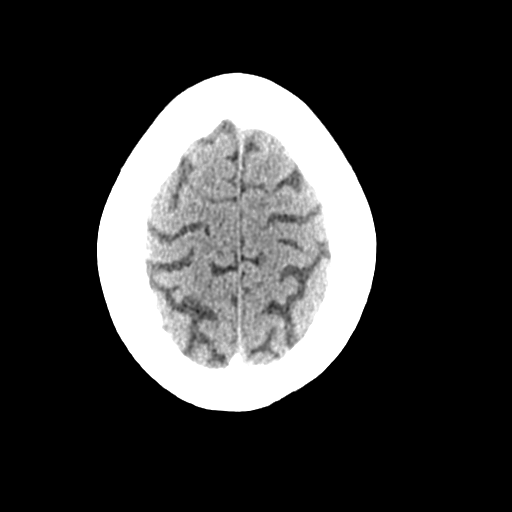
[im 25/29  brain]
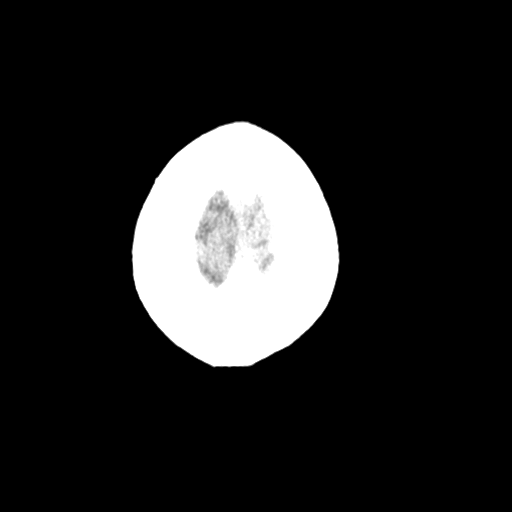

[Series 3: head bone · axial · 0.39mm/px · z∈[-181,-153]mm · 3 of 72 slices shown]
[im 8/72  bone]
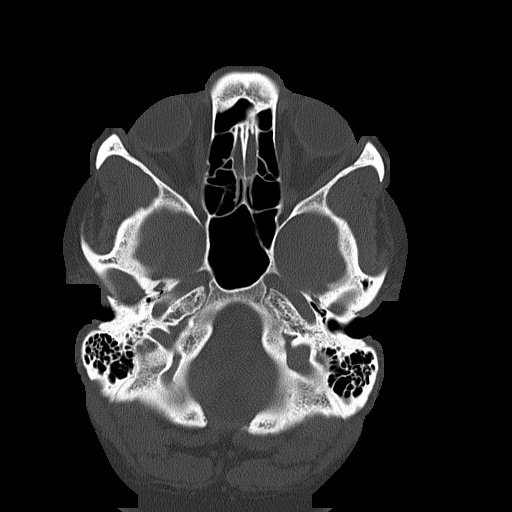
[im 15/72  bone]
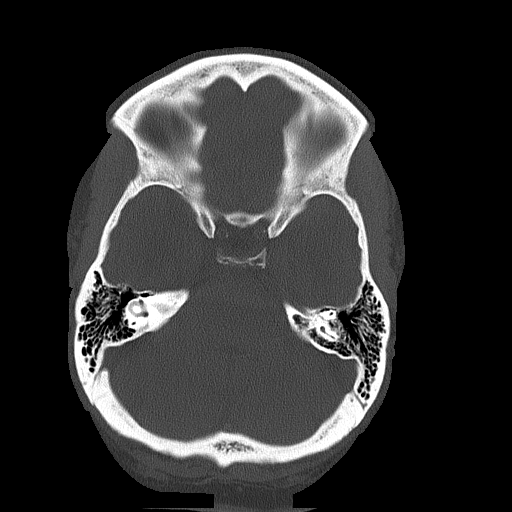
[im 22/72  bone]
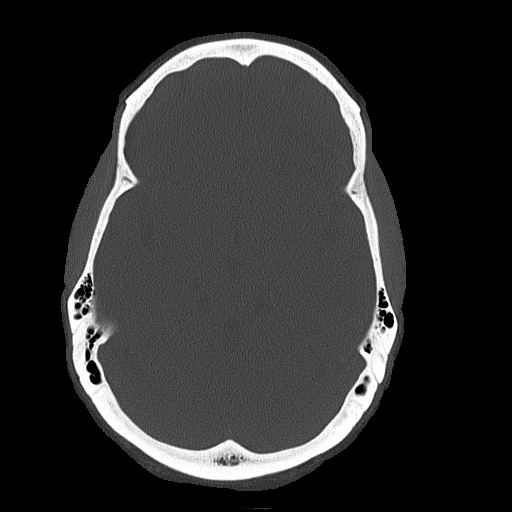

[Series 4: coronal soft tissue · coronal · 0.29mm/px · 3 of 65 slices shown]
[im 22/65  brain]
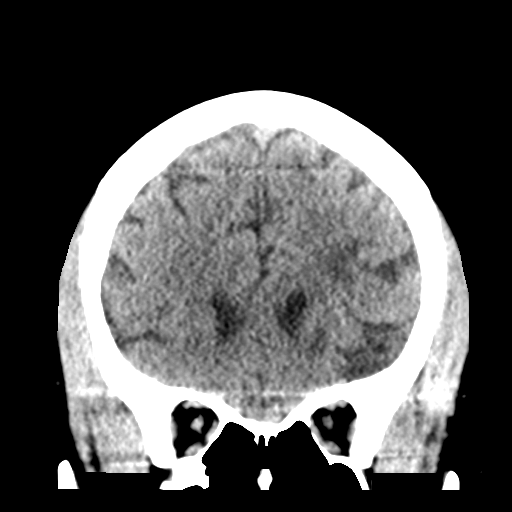
[im 29/65  brain]
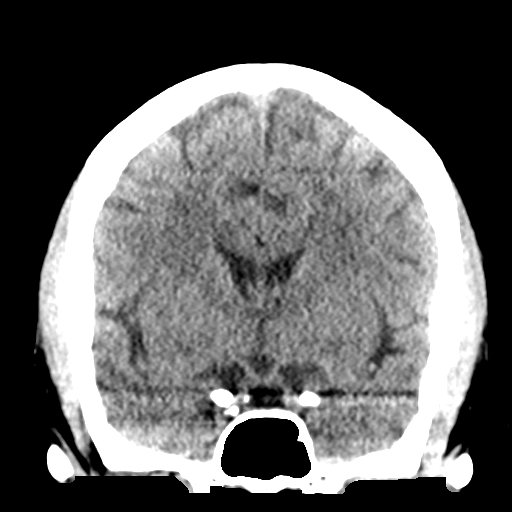
[im 36/65  brain]
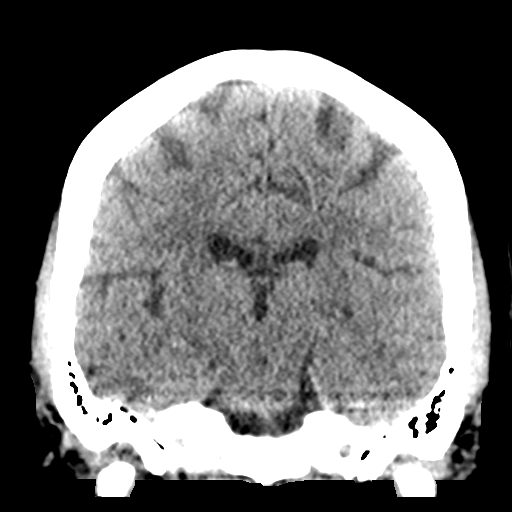

[Series 5: sagittal soft tissue · sagittal · 0.27mm/px · 3 of 49 slices shown]
[im 17/49  brain]
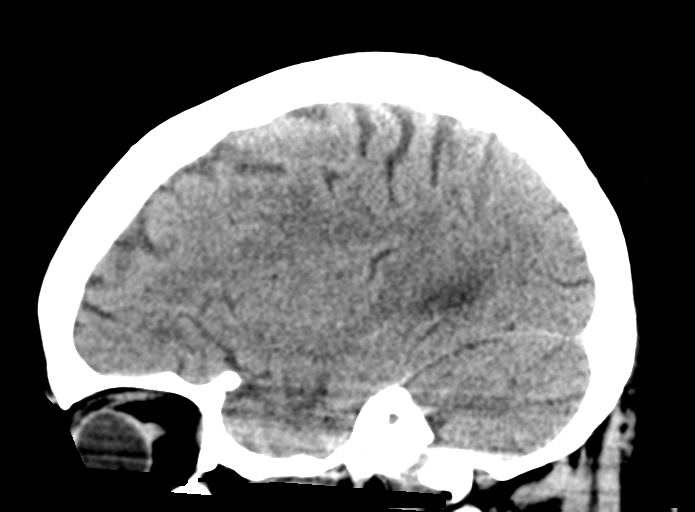
[im 25/49  brain]
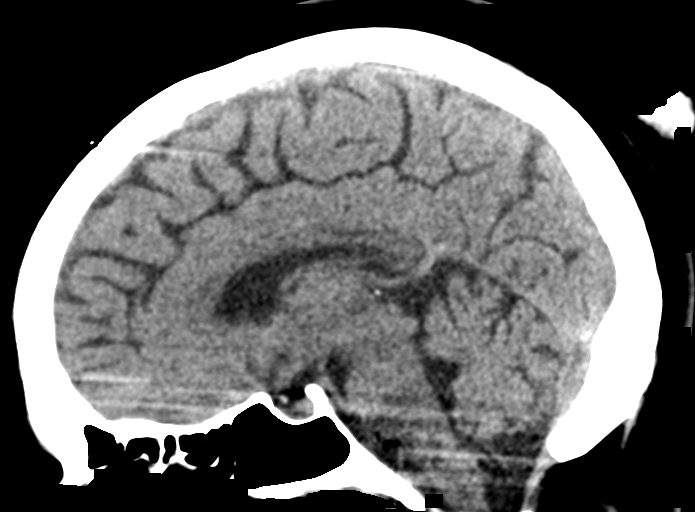
[im 33/49  brain]
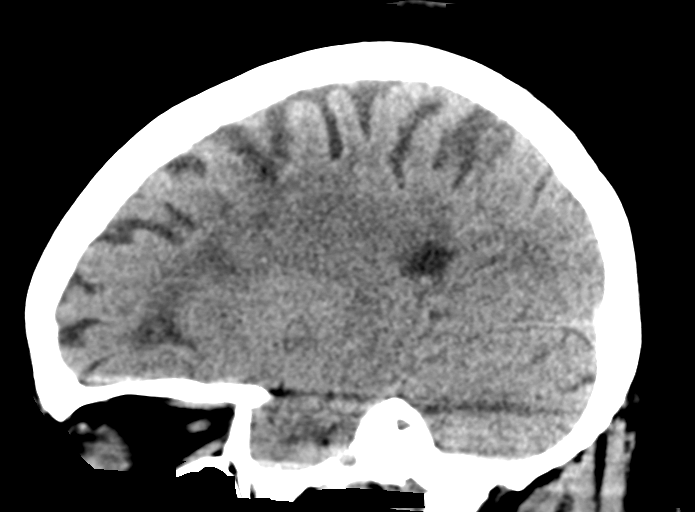

[16 of 47 positions shown; findings below may reference images not displayed]

FINDINGS: Brain: No evidence of acute infarction, hemorrhage, hydrocephalus,
extra-axial collection or mass lesion/mass effect.

Several foci of chronic encephalomalacia noted at the left frontal
lobe, both superiorly and inferiorly, reflecting remote infarct. A
chronic lacunar infarct is noted at the right cerebellar hemisphere.
Mild subcortical white matter change likely reflects small vessel
ischemic microangiopathy.

The brainstem and fourth ventricle are within normal limits. The
basal ganglia are unremarkable in appearance. No mass effect or
midline shift is seen.

Vascular: No hyperdense vessel or unexpected calcification.

Skull: There is no evidence of fracture; visualized osseous
structures are unremarkable in appearance.

Sinuses/Orbits: The visualized portions of the orbits are within
normal limits. The paranasal sinuses and mastoid air cells are
well-aerated.

Other: A metallic BB is noted embedded within the soft tissues near
the calvarium at the anterior vertex.
IMPRESSION: 1. No acute intracranial pathology seen on CT.
2. Several foci of chronic encephalomalacia at the left frontal
lobe, both superiorly and inferiorly, reflecting remote infarct.
3. Chronic lacunar infarct at the right cerebellar hemisphere.
4. Mild small vessel ischemic microangiopathy.

## 2017-05-29 ENCOUNTER — Inpatient Hospital Stay
Admission: EM | Admit: 2017-05-29 | Discharge: 2017-05-30 | DRG: 190 | Disposition: A | Discharge status: Home / Self Care | Payer: Medicare Other | Attending: Internal Medicine | Admitting: Internal Medicine

## 2017-05-29 ENCOUNTER — Emergency Department: Payer: Medicare Other

## 2017-05-29 DIAGNOSIS — J441 Chronic obstructive pulmonary disease with (acute) exacerbation: Principal | ICD-10-CM | POA: Diagnosis present

## 2017-05-29 DIAGNOSIS — Z9114 Patient's other noncompliance with medication regimen: Secondary | ICD-10-CM

## 2017-05-29 DIAGNOSIS — Z888 Allergy status to other drugs, medicaments and biological substances status: Secondary | ICD-10-CM

## 2017-05-29 DIAGNOSIS — Z833 Family history of diabetes mellitus: Secondary | ICD-10-CM

## 2017-05-29 DIAGNOSIS — J9601 Acute respiratory failure with hypoxia: Secondary | ICD-10-CM | POA: Diagnosis present

## 2017-05-29 DIAGNOSIS — Z716 Tobacco abuse counseling: Secondary | ICD-10-CM | POA: Diagnosis not present

## 2017-05-29 DIAGNOSIS — Z79899 Other long term (current) drug therapy: Secondary | ICD-10-CM

## 2017-05-29 DIAGNOSIS — Z7984 Long term (current) use of oral hypoglycemic drugs: Secondary | ICD-10-CM | POA: Diagnosis not present

## 2017-05-29 DIAGNOSIS — E119 Type 2 diabetes mellitus without complications: Secondary | ICD-10-CM | POA: Diagnosis not present

## 2017-05-29 DIAGNOSIS — I10 Essential (primary) hypertension: Secondary | ICD-10-CM | POA: Diagnosis not present

## 2017-05-29 DIAGNOSIS — F17210 Nicotine dependence, cigarettes, uncomplicated: Secondary | ICD-10-CM | POA: Diagnosis not present

## 2017-05-29 DIAGNOSIS — R569 Unspecified convulsions: Secondary | ICD-10-CM | POA: Diagnosis present

## 2017-05-29 LAB — COMPREHENSIVE METABOLIC PANEL
ALT: 27 U/L (ref 14–54)
AST: 37 U/L (ref 15–41)
Albumin: 3.8 g/dL (ref 3.5–5.0)
Alkaline Phosphatase: 103 U/L (ref 38–126)
Anion gap: 11 (ref 5–15)
BUN: 12 mg/dL (ref 6–20)
CHLORIDE: 102 mmol/L (ref 101–111)
CO2: 27 mmol/L (ref 22–32)
CREATININE: 0.86 mg/dL (ref 0.44–1.00)
Calcium: 9.2 mg/dL (ref 8.9–10.3)
Glucose, Bld: 122 mg/dL — ABNORMAL HIGH (ref 65–99)
POTASSIUM: 2.7 mmol/L — AB (ref 3.5–5.1)
SODIUM: 140 mmol/L (ref 135–145)
Total Bilirubin: 0.7 mg/dL (ref 0.3–1.2)
Total Protein: 7.1 g/dL (ref 6.5–8.1)

## 2017-05-29 LAB — CBC
HCT: 40.7 % (ref 35.0–47.0)
Hemoglobin: 13.8 g/dL (ref 12.0–16.0)
MCH: 30.2 pg (ref 26.0–34.0)
MCHC: 33.9 g/dL (ref 32.0–36.0)
MCV: 89.3 fL (ref 80.0–100.0)
PLATELETS: 153 10*3/uL (ref 150–440)
RBC: 4.56 MIL/uL (ref 3.80–5.20)
RDW: 15.2 % — AB (ref 11.5–14.5)
WBC: 8.2 10*3/uL (ref 3.6–11.0)

## 2017-05-29 LAB — BRAIN NATRIURETIC PEPTIDE: B NATRIURETIC PEPTIDE 5: 230 pg/mL — AB (ref 0.0–100.0)

## 2017-05-29 LAB — GLUCOSE, CAPILLARY
GLUCOSE-CAPILLARY: 278 mg/dL — AB (ref 65–99)
GLUCOSE-CAPILLARY: 336 mg/dL — AB (ref 65–99)

## 2017-05-29 LAB — TROPONIN I: Troponin I: <0.03 ng/mL (ref <0.03)

## 2017-05-29 MED ORDER — IPRATROPIUM-ALBUTEROL 0.5-2.5 (3) MG/3ML IN SOLN
3.0000 mL | Freq: Four times a day (QID) | RESPIRATORY_TRACT | Status: DC
  Administered 2017-05-29 – 2017-05-30 (×3): 3 mL via RESPIRATORY_TRACT
  Filled 2017-05-29 (×4): qty 3

## 2017-05-29 MED ORDER — ACETAMINOPHEN 325 MG PO TABS: 650.0000 mg | ORAL_TABLET | Freq: Four times a day (QID) | ORAL | Status: DC | PRN

## 2017-05-29 MED ORDER — ONDANSETRON HCL 4 MG PO TABS: 4.0000 mg | ORAL_TABLET | Freq: Four times a day (QID) | ORAL | Status: DC | PRN

## 2017-05-29 MED ORDER — LEVETIRACETAM 750 MG PO TABS
750.0000 mg | ORAL_TABLET | Freq: Two times a day (BID) | ORAL | Status: DC
  Administered 2017-05-29 – 2017-05-30 (×2): 750 mg via ORAL
  Filled 2017-05-29 (×3): qty 1

## 2017-05-29 MED ORDER — ATENOLOL 25 MG PO TABS
100.0000 mg | ORAL_TABLET | Freq: Every day | ORAL | Status: DC
  Administered 2017-05-30: 100 mg via ORAL
  Filled 2017-05-29: qty 4

## 2017-05-29 MED ORDER — TRAMADOL HCL 50 MG PO TABS: 50.0000 mg | ORAL_TABLET | Freq: Four times a day (QID) | ORAL | Status: DC | PRN

## 2017-05-29 MED ORDER — FLUOXETINE HCL 10 MG PO CAPS
10.0000 mg | ORAL_CAPSULE | Freq: Every day | ORAL | Status: DC
  Administered 2017-05-29 – 2017-05-30 (×2): 10 mg via ORAL
  Filled 2017-05-29 (×2): qty 1

## 2017-05-29 MED ORDER — METHYLPREDNISOLONE SODIUM SUCC 125 MG IJ SOLR
125.0000 mg | Freq: Once | INTRAMUSCULAR | Status: AC
  Administered 2017-05-29: 125 mg via INTRAVENOUS
  Filled 2017-05-29: qty 2

## 2017-05-29 MED ORDER — ATORVASTATIN CALCIUM 20 MG PO TABS
40.0000 mg | ORAL_TABLET | Freq: Every day | ORAL | Status: DC
  Administered 2017-05-30: 40 mg via ORAL
  Filled 2017-05-29: qty 2

## 2017-05-29 MED ORDER — METFORMIN HCL 500 MG PO TABS
500.0000 mg | ORAL_TABLET | Freq: Two times a day (BID) | ORAL | Status: DC
  Administered 2017-05-29 – 2017-05-30 (×2): 500 mg via ORAL
  Filled 2017-05-29 (×2): qty 1

## 2017-05-29 MED ORDER — ONDANSETRON HCL 4 MG/2ML IJ SOLN: 4.0000 mg | Freq: Four times a day (QID) | INTRAMUSCULAR | Status: DC | PRN

## 2017-05-29 MED ORDER — ACETAMINOPHEN 650 MG RE SUPP: 650.0000 mg | Freq: Four times a day (QID) | RECTAL | Status: DC | PRN

## 2017-05-29 MED ORDER — ATENOLOL-CHLORTHALIDONE 100-25 MG PO TABS: 1.0000 | ORAL_TABLET | Freq: Every day | ORAL | Status: DC

## 2017-05-29 MED ORDER — METHYLPREDNISOLONE SODIUM SUCC 125 MG IJ SOLR
60.0000 mg | Freq: Two times a day (BID) | INTRAMUSCULAR | Status: DC
  Administered 2017-05-29 – 2017-05-30 (×2): 60 mg via INTRAVENOUS
  Filled 2017-05-29 (×2): qty 2

## 2017-05-29 MED ORDER — INSULIN ASPART 100 UNIT/ML ~~LOC~~ SOLN
0.0000 [IU] | Freq: Every day | SUBCUTANEOUS | Status: DC
  Administered 2017-05-29: 4 [IU] via SUBCUTANEOUS
  Filled 2017-05-29: qty 1

## 2017-05-29 MED ORDER — GUAIFENESIN-DM 100-10 MG/5ML PO SYRP
5.0000 mL | ORAL_SOLUTION | ORAL | Status: DC | PRN
Start: 2017-05-29 — End: 2017-05-30
  Administered 2017-05-30: 5 mL via ORAL
  Filled 2017-05-29 (×2): qty 5

## 2017-05-29 MED ORDER — HYDROCOD POLST-CPM POLST ER 10-8 MG/5ML PO SUER
5.0000 mL | Freq: Two times a day (BID) | ORAL | Status: DC | PRN
  Administered 2017-05-29: 5 mL via ORAL
  Filled 2017-05-29: qty 5

## 2017-05-29 MED ORDER — ALBUTEROL SULFATE (2.5 MG/3ML) 0.083% IN NEBU: 2.5000 mg | INHALATION_SOLUTION | RESPIRATORY_TRACT | Status: DC | PRN

## 2017-05-29 MED ORDER — IPRATROPIUM-ALBUTEROL 0.5-2.5 (3) MG/3ML IN SOLN
3.0000 mL | Freq: Once | RESPIRATORY_TRACT | Status: AC
  Administered 2017-05-29: 3 mL via RESPIRATORY_TRACT
  Filled 2017-05-29: qty 3

## 2017-05-29 MED ORDER — ENOXAPARIN SODIUM 40 MG/0.4ML ~~LOC~~ SOLN
40.0000 mg | SUBCUTANEOUS | Status: DC
  Administered 2017-05-29: 40 mg via SUBCUTANEOUS
  Filled 2017-05-29: qty 0.4

## 2017-05-29 MED ORDER — BUDESONIDE 0.5 MG/2ML IN SUSP
0.5000 mg | Freq: Two times a day (BID) | RESPIRATORY_TRACT | Status: DC
  Administered 2017-05-29 – 2017-05-30 (×2): 0.5 mg via RESPIRATORY_TRACT
  Filled 2017-05-29 (×2): qty 2

## 2017-05-29 MED ORDER — INSULIN ASPART 100 UNIT/ML ~~LOC~~ SOLN
0.0000 [IU] | Freq: Three times a day (TID) | SUBCUTANEOUS | Status: DC
  Administered 2017-05-29: 8 [IU] via SUBCUTANEOUS
  Administered 2017-05-30 (×2): 5 [IU] via SUBCUTANEOUS
  Filled 2017-05-29 (×3): qty 1

## 2017-05-29 MED ORDER — POLYETHYLENE GLYCOL 3350 17 G PO PACK: 17.0000 g | PACK | Freq: Every day | ORAL | Status: DC | PRN

## 2017-05-29 MED ORDER — CHLORTHALIDONE 25 MG PO TABS
25.0000 mg | ORAL_TABLET | Freq: Every day | ORAL | Status: DC
  Administered 2017-05-30: 25 mg via ORAL
  Filled 2017-05-29 (×2): qty 1

## 2017-05-29 MED ORDER — POTASSIUM CHLORIDE CRYS ER 20 MEQ PO TBCR
40.0000 meq | EXTENDED_RELEASE_TABLET | ORAL | Status: AC
  Administered 2017-05-29 (×2): 40 meq via ORAL
  Filled 2017-05-29 (×2): qty 2

## 2017-05-29 MED ORDER — GABAPENTIN 300 MG PO CAPS
900.0000 mg | ORAL_CAPSULE | Freq: Three times a day (TID) | ORAL | Status: DC
  Administered 2017-05-29 – 2017-05-30 (×2): 900 mg via ORAL
  Filled 2017-05-29 (×2): qty 3

## 2017-05-29 NOTE — ED Triage Notes (Signed)
Pt was at home and began to have severe ShOB.  Pt states she has been ShOB for two weeks but today was worse.  Pt has a history of asthma.  Pt states its difficult to catch her breath.  EMS gave a neb treatment and solumedrol in route.  Pt is a current smoker.

## 2017-05-29 NOTE — ED Provider Notes (Signed)
[facility] Emergency Department Provider Note   ____________________________________________    I have reviewed the triage vital signs and the nursing notes.   HISTORY  Chief Complaint Shortness of Breath     HPI Jennifer Weiss is a 56 y.o. female who presents with complaints of shortness of breath. Patient reports over the last 2 days she has had increasing difficulty breathing. She reports a history of COPD and notes that she continues to smoke. She denies chest pain. She denies fevers or chills. Nonproductive dry cough. No recent travel. No calf pain or swelling. She is not on home oxygen   Past Medical History:  Diagnosis Date  . Arthritis   . Asthma   . Cancer ([place])    ovarian  . Diabetes mellitus without complication ([place])   . Hypertension   . Seizures [facility])     Patient Active Problem List   Diagnosis Date Noted  . COPD exacerbation ([place]) 05/29/2017  . Hypoxia 12/31/2016    Past Surgical History:  Procedure Laterality Date  . none      Prior to Admission medications   Medication Sig Start Date End Date Taking? Authorizing Provider  acyclovir (ZOVIRAX) 800 MG tablet Take 800 mg by mouth daily.    [provider]  albuterol (PROVENTIL HFA;VENTOLIN HFA) 108 (90 Base) MCG/ACT inhaler Inhale 2 puffs into the lungs every 4 (four) hours as needed for wheezing or shortness of breath.    [provider]  atenolol-chlorthalidone (TENORETIC) 100-25 MG tablet Take 1 tablet by mouth daily.    [provider]  atorvastatin (LIPITOR) 40 MG tablet Take 40 mg by mouth daily.    [provider]  FLUoxetine (PROZAC) 10 MG capsule Take 10 mg by mouth daily.    [provider]  fluticasone (FLOVENT HFA) 110 MCG/ACT inhaler Inhale 2 puffs into the lungs 2 (two) times daily.    [provider]  gabapentin (NEURONTIN) 300 MG capsule Take 900 mg by mouth 3 (three) times daily.    [provider]   ipratropium (ATROVENT HFA) 17 MCG/ACT inhaler Inhale 2 puffs into the lungs 2 (two) times daily.    [provider]  levETIRAcetam (KEPPRA) 750 MG tablet Take 1 tablet (750 mg total) by mouth 2 (two) times daily. 02/15/17   Delman Kitten, MD  metFORMIN (GLUCOPHAGE) 500 MG tablet Take 500 mg by mouth 2 (two) times daily with a meal.    [provider]  oseltamivir (TAMIFLU) 30 MG capsule Take 1 capsule (30 mg total) by mouth 2 (two) times daily. 01/01/17   Hillary Bow, MD  predniSONE (DELTASONE) 50 MG tablet Take 1 tablet (50 mg total) by mouth daily with breakfast. 01/01/17   Hillary Bow, MD     Allergies Nystatin  Family History  Problem Relation Age of Onset  . Diabetes Mellitus II Mother     Social History Social History  Substance Use Topics  . Smoking status: Current Every Day Smoker    Packs/day: 0.50    Types: Cigarettes  . Smokeless tobacco: Never Used  . Alcohol use No    Review of Systems  Constitutional: No fever/chills Eyes: No visual changes.  ENT: No sore throat. Cardiovascular: Denies chest pain. Respiratory: As above Gastrointestinal: No abdominal pain.  No nausea, no vomiting.   Genitourinary: Negative for dysuria. Musculoskeletal: Negative for back pain. Skin: Negative for rash. Neurological: Negative for headaches or weakness   ____________________________________________   PHYSICAL EXAM:  VITAL  SIGNS: ED Triage Vitals  Enc Vitals Group     BP 05/29/17 1353 134/80     Pulse Rate 05/29/17 1353 (!) 109     Resp 05/29/17 1353 (!) 23     Temp --      Temp src --      SpO2 05/29/17 1353 92 %     Weight 05/29/17 1354 90.7 kg (200 lb)     Height --      Head Circumference --      Peak Flow --      Pain Score --      Pain Loc --      Pain Edu? --      Excl. in [place]? --     Constitutional: Alert and oriented. Pleasant and interactive Eyes: Conjunctivae are normal.   Nose: No congestion/rhinnorhea. Mouth/Throat: Mucous  membranes are moist.    Cardiovascular: Tachycardia, regular rhythm. Grossly normal heart sounds.  Good peripheral circulation. Respiratory: Increased respiratory effort with diffuse wheezes Gastrointestinal: Soft and nontender. No distention.  No CVA tenderness. Genitourinary: deferred Musculoskeletal: No lower extremity tenderness nor edema.  Warm and well perfused Neurologic:  Normal speech and language. No gross focal neurologic deficits are appreciated.  Skin:  Skin is warm, dry and intact. No rash noted. Psychiatric: Mood and affect are normal. Speech and behavior are normal.  ____________________________________________   LABS (all labs ordered are listed, but only abnormal results are displayed)  Labs Reviewed  CBC - Abnormal; Notable for the following:       Result Value   RDW 15.2 (*)    All other components within normal limits  COMPREHENSIVE METABOLIC PANEL - Abnormal; Notable for the following:    Potassium 2.7 (*)    Glucose, Bld 122 (*)    All other components within normal limits  TROPONIN I  BRAIN NATRIURETIC PEPTIDE  HIV ANTIBODY (ROUTINE TESTING)  HEMOGLOBIN A1C   ____________________________________________  EKG  ED ECG REPORT I, Lavonia Drafts, the attending physician, personally viewed and interpreted this ECG.  Date: 05/29/2017  Rate: 106 Rhythm: Sinus tachycardia QRS Axis: normal Intervals: normal ST/T Wave abnormalities: Nonspecific changes Narrative Interpretation: unremarkable  ____________________________________________  RADIOLOGY  Chest x-ray no acute distress ____________________________________________   PROCEDURES  Procedure(s) performed: No    Critical Care performed: No ____________________________________________   INITIAL IMPRESSION / ASSESSMENT AND PLAN / ED COURSE  Pertinent labs & imaging results that were available during my care of the patient were reviewed by me and considered in my medical decision making  (see chart for details).  Patient treated with multiple nebulizers, Solu-Medrol with minimal improvement. Continue diffuse wheezing on reexam. Requiring O2 to keep sats above 90%. We'll admit for further evaluation    ____________________________________________   FINAL CLINICAL IMPRESSION(S) / ED DIAGNOSES  Final diagnoses:  COPD exacerbation ([place])      NEW MEDICATIONS STARTED DURING THIS VISIT:  New Prescriptions   No medications on file     Note:  This document was prepared using Dragon voice recognition software and may include unintentional dictation errors.    Lavonia Drafts, MD 05/29/17 [phone]

## 2017-05-29 NOTE — H&P (Signed)
[facility] [place] NAME: Jennifer Weiss    MR#:  [id number]  DATE OF BIRTH:  1961/11/27  DATE OF ADMISSION:  05/29/2017  PRIMARY CARE PHYSICIAN: [place]   REQUESTING/REFERRING PHYSICIAN: Dr. Corky Downs  CHIEF COMPLAINT:   Chief Complaint  Patient presents with  . Shortness of Breath    HISTORY OF PRESENT ILLNESS:  Jennifer Weiss  is a 56 y.o. female with a known history of COPD, diabetes, hypertension, tobacco use presents to the emergency room complaining of cough and shortness of breath of 2 weeks which worsened acutely today morning and had to come to the emergency room. Patient continues to smoke. Had inhalers that were lost when she moved. She also tells me that she cannot read and does not check her blood sugars. Overall she doesn't keep up with her health care visits her medications at well. Here in the emergency room patient desatted to less than 88% on room air. Has acute wheezing in spite of IV steroids and multiple nebulizer treatment. Patient is being admitted for COPD exacerbation and acute hypoxic respiratory failure. Patient was admitted the last time here in January 2018 when she had similar exacerbation with influenza.  PAST MEDICAL HISTORY:   Past Medical History:  Diagnosis Date  . Arthritis   . Asthma   . Cancer ([place])    ovarian  . Diabetes mellitus without complication ([place])   . Hypertension   . Seizures ([place])     PAST SURGICAL HISTORY:   Past Surgical History:  Procedure Laterality Date  . none      SOCIAL HISTORY:   Social History  Substance Use Topics  . Smoking status: Current Every Day Smoker    Packs/day: 0.50    Types: Cigarettes  . Smokeless tobacco: Never Used  . Alcohol use No    FAMILY HISTORY:   Family History  Problem Relation Age of Onset  . Diabetes Mellitus II Mother     DRUG ALLERGIES:   Allergies  Allergen Reactions  . Nystatin Other (See Comments)     Reaction: unknown    REVIEW OF SYSTEMS:   Review of Systems  Constitutional: Positive for malaise/fatigue. Negative for chills, fever and weight loss.  HENT: Negative for hearing loss and nosebleeds.   Eyes: Negative for blurred vision, double vision and pain.  Respiratory: Positive for cough, sputum production, shortness of breath and wheezing. Negative for hemoptysis.   Cardiovascular: Positive for chest pain. Negative for palpitations, orthopnea and leg swelling.  Gastrointestinal: Negative for abdominal pain, constipation, diarrhea, nausea and vomiting.  Genitourinary: Negative for dysuria and hematuria.  Musculoskeletal: Negative for back pain, falls and myalgias.  Skin: Negative for rash.  Neurological: Positive for weakness. Negative for dizziness, tremors, sensory change, speech change, focal weakness, seizures and headaches.  Endo/Heme/Allergies: Does not bruise/bleed easily.  Psychiatric/Behavioral: Negative for depression and memory loss. The patient is not nervous/anxious.     MEDICATIONS AT HOME:   Prior to Admission medications   Medication Sig Start Date End Date Taking? Authorizing Provider  acyclovir (ZOVIRAX) 800 MG tablet Take 800 mg by mouth daily.    [provider]  albuterol (PROVENTIL HFA;VENTOLIN HFA) 108 (90 Base) MCG/ACT inhaler Inhale 2 puffs into the lungs every 4 (four) hours as needed for wheezing or shortness of breath.    [provider]  atenolol-chlorthalidone (TENORETIC) 100-25 MG tablet Take 1 tablet by mouth daily.    [provider]  atorvastatin (LIPITOR) 40 MG tablet Take 40 mg by mouth daily.    [provider]  FLUoxetine (PROZAC) 10 MG capsule Take 10 mg by mouth daily.    [provider]  fluticasone (FLOVENT HFA) 110 MCG/ACT inhaler Inhale 2 puffs into the lungs 2 (two) times daily.    [provider]  gabapentin (NEURONTIN) 300 MG capsule Take 900 mg by mouth 3 (three) times daily.     [provider]  ipratropium (ATROVENT HFA) 17 MCG/ACT inhaler Inhale 2 puffs into the lungs 2 (two) times daily.    [provider]  levETIRAcetam (KEPPRA) 750 MG tablet Take 1 tablet (750 mg total) by mouth 2 (two) times daily. 02/15/17   Delman Kitten, MD  metFORMIN (GLUCOPHAGE) 500 MG tablet Take 500 mg by mouth 2 (two) times daily with a meal.    [provider]  oseltamivir (TAMIFLU) 30 MG capsule Take 1 capsule (30 mg total) by mouth 2 (two) times daily. 01/01/17   Hillary Bow, MD  predniSONE (DELTASONE) 50 MG tablet Take 1 tablet (50 mg total) by mouth daily with breakfast. 01/01/17   Hillary Bow, MD     VITAL SIGNS:  Blood pressure 134/80, pulse (!) 109, resp. rate (!) 23, weight 102.1 kg (225 lb), SpO2 92 %.  PHYSICAL EXAMINATION:  Physical Exam  GENERAL:  56 y.o.-year-old patient lying in the bed with conversational dyspnea EYES: Pupils equal, round, reactive to light and accommodation. No scleral icterus. Extraocular muscles intact.  HEENT: Head atraumatic, normocephalic. Oropharynx and nasopharynx clear. No oropharyngeal erythema, moist oral mucosa  NECK:  Supple, no jugular venous distention. No thyroid enlargement, no tenderness.  LUNGS: increase work of breathing with bilateral wheezing CARDIOVASCULAR: S1, S2 normal. No murmurs, rubs, or gallops.  ABDOMEN: Soft, nontender, nondistended. Bowel sounds present. No organomegaly or mass.  EXTREMITIES: No pedal edema, cyanosis, or clubbing. + 2 pedal & radial pulses b/l.   NEUROLOGIC: Cranial nerves II through XII are intact. No focal Motor or sensory deficits appreciated b/l PSYCHIATRIC: The patient is alert and oriented x 3. Good affect.  SKIN: No obvious rash, lesion, or ulcer.   LABORATORY PANEL:   CBC  Recent Labs Lab 05/29/17 1442  WBC 8.2  HGB 13.8  HCT 40.7  PLT 153    ------------------------------------------------------------------------------------------------------------------  Chemistries   Recent Labs Lab 05/29/17 1442  NA 140  K 2.7*  CL 102  CO2 27  GLUCOSE 122*  BUN 12  CREATININE 0.86  CALCIUM 9.2  AST 37  ALT 27  ALKPHOS 103  BILITOT 0.7   ------------------------------------------------------------------------------------------------------------------  Cardiac Enzymes  Recent Labs Lab 05/29/17 1442  TROPONINI <0.03   ------------------------------------------------------------------------------------------------------------------  RADIOLOGY:  Dg Chest Portable 1 View  Result Date: 05/29/2017 CLINICAL DATA:  Severe shortness of breath intermittently over the past 2 weeks but symptoms are worse today. History of asthma and diabetes current smoker. EXAM: PORTABLE CHEST 1 VIEW COMPARISON:  PA and lateral chest x-ray of December 31, 2016 FINDINGS: The lungs are adequately inflated. There is no focal infiltrate. The cardiac silhouette is mildly enlarged. The central pulmonary vascularity is prominent. There may be a small left pleural effusion. IMPRESSION: Findings suggest mild CHF. No definite pneumonia. When the patient can tolerate the procedure, a PA and lateral chest x-ray would be useful. Electronically Signed   By: David  Martinique M.D.   On: 05/29/2017 14:32     IMPRESSION AND PLAN:   * Acute copd excerebration with acute hypoxic resp failure -IV  steroids, Antibiotics - Scheduled Nebulizers - Inhalers -Wean O2 as tolerated - Consult pulmonary if no improvement  * DM SSI Will likely be uncontrolled due to IV steroid use  * HTN Continue home meds  * seizures. Continue Keppra  * tobacco use. Counseled to quit smoking greater. > 3 minutes spent  * DVT prophylaxis with Lovenox  All the records are reviewed and case discussed with ED provider. Management plans discussed with the patient, family and they are in  agreement.  CODE STATUS: FULL CODE  TOTAL TIME TAKING CARE OF THIS PATIENT: 40 minutes.   Hillary Bow R M.D on 05/29/2017 at 3:49 PM  Between 7am to 6pm - Pager - [phone]  After 6pm go to [email or web address] - password [email or web address] [facility]  Office  [phone]  CC: Primary care physician; [place]  Note: This dictation was prepared with Dragon dictation along with smaller phrase technology. Any transcriptional errors that result from this process are unintentional.

## 2017-05-30 DIAGNOSIS — J441 Chronic obstructive pulmonary disease with (acute) exacerbation: Secondary | ICD-10-CM | POA: Diagnosis not present

## 2017-05-30 LAB — CBC
HCT: 40.4 % (ref 35.0–47.0)
Hemoglobin: 13.4 g/dL (ref 12.0–16.0)
MCH: 29.9 pg (ref 26.0–34.0)
MCHC: 33.3 g/dL (ref 32.0–36.0)
MCV: 89.9 fL (ref 80.0–100.0)
PLATELETS: 160 10*3/uL (ref 150–440)
RBC: 4.5 MIL/uL (ref 3.80–5.20)
RDW: 15.5 % — AB (ref 11.5–14.5)
WBC: 11 10*3/uL (ref 3.6–11.0)

## 2017-05-30 LAB — BASIC METABOLIC PANEL
Anion gap: 12 (ref 5–15)
BUN: 17 mg/dL (ref 6–20)
CALCIUM: 9.4 mg/dL (ref 8.9–10.3)
CHLORIDE: 104 mmol/L (ref 101–111)
CO2: 25 mmol/L (ref 22–32)
CREATININE: 0.92 mg/dL (ref 0.44–1.00)
GFR calc non Af Amer: >60 mL/min (ref >60)
GLUCOSE: 284 mg/dL — AB (ref 65–99)
Potassium: 3.6 mmol/L (ref 3.5–5.1)
Sodium: 141 mmol/L (ref 135–145)

## 2017-05-30 LAB — GLUCOSE, CAPILLARY
GLUCOSE-CAPILLARY: 241 mg/dL — AB (ref 65–99)
GLUCOSE-CAPILLARY: 229 mg/dL — AB (ref 65–99)

## 2017-05-30 MED ORDER — GUAIFENESIN-DM 100-10 MG/5ML PO SYRP: 5.0000 mL | ORAL_SOLUTION | ORAL | 0 refills | Status: DC | PRN

## 2017-05-30 MED ORDER — PREDNISONE 50 MG PO TABS: 50.0000 mg | ORAL_TABLET | Freq: Every day | ORAL | 0 refills | Status: DC

## 2017-05-30 MED ORDER — IPRATROPIUM-ALBUTEROL 0.5-2.5 (3) MG/3ML IN SOLN: 3.0000 mL | RESPIRATORY_TRACT | 0 refills | Status: AC | PRN

## 2017-05-30 NOTE — Progress Notes (Deleted)
Chaplain received page of patient requesting to speak with [facility]. [place] visited patient and family. Patient shared thinking she was going to die. CH was able to ask RN to update patient and status. RN did so, stating physician stated so, if patient did not stop smoking, but patient has improved and will be discharged soon. Provided emotional support.

## 2017-05-30 NOTE — Progress Notes (Signed)
Chaplain received page of patient requesting to speak with [facility]. [place] visited patient and family. Patient shared thinking she was going to die. CH was able to ask RN to update patient and status. RN did so, stating physician stated so, if patient did not stop smoking, but patient has improved and will be discharged soon. Provided emotional support.     05/30/17 1115  Clinical Encounter Type  Visited With Patient and family together  Visit Type Initial;Spiritual support  Referral From Nurse  Consult/Referral To Chaplain  Spiritual Encounters  Spiritual Needs Emotional;Other (Comment)

## 2017-05-30 NOTE — Care Management Note (Signed)
Case Management Note  Patient Details  Name: Jennifer Weiss MRN: [id number] Date of Birth: January 29, 1961  Subjective/Objective:    Order for a home nebulizer per order for Duoneb medication via home nebulizer was called to [facility] along with an order for a RW. [name] has a hx of seizures, COPD and Asthma.                Action/Plan:   Expected Discharge Date:  05/30/17               Expected Discharge Plan:   05/30/17  In-House Referral:     Discharge planning Services     Post Acute Care Choice:    Choice offered to:     DME Arranged:   nebulizer machine, RW DME Agency:     HH Arranged:    HH Agency:     Status of Service:   Completed.   If discussed at [place] of Stay Meetings, dates discussed:    Additional Comments:  Gara Kincade A, RN 05/30/2017, 10:49 AM

## 2017-05-30 NOTE — Discharge Instructions (Signed)
Resume diet and activity as before  QUIT SMOKING

## 2017-05-30 NOTE — Progress Notes (Signed)
Family present. Patient crying stating that she was told by the physician that there was nothing further he could do and she was going to die.  I contacted Dr. Darvin Neighbours and clarified the conversation.  I explained to the patient and family that he said with her lung issues, if she continues to smoke the likelihood of her dying increases and he encouraged her to stop smoking.  Family and patient acknowledged understanding. Patient calmed down. Lauris Poag, RN 05/30/17 1139

## 2017-05-30 NOTE — Progress Notes (Signed)
Patient ambulated hall with walker. Ambulated 40 feet. Oxygen saturation after ambulation on room air was 94%.  MD advised. Lauris Poag, RN 05/30/17 1000am

## 2017-05-31 LAB — HIV ANTIBODY (ROUTINE TESTING W REFLEX): HIV SCREEN 4TH GENERATION: NONREACTIVE

## 2017-05-31 LAB — HEMOGLOBIN A1C
HEMOGLOBIN A1C: 6 % — AB (ref 4.8–5.6)
Mean Plasma Glucose: 126 mg/dL

## 2017-06-02 NOTE — Discharge Summary (Signed)
[facility] [place] NAME: Jennifer Weiss    MR#:  [id number]  DATE OF BIRTH:  1961-01-03  DATE OF ADMISSION:  05/29/2017 ADMITTING PHYSICIAN: Hillary Bow, MD  DATE OF DISCHARGE: 05/30/2017  2:04 PM  PRIMARY CARE PHYSICIAN: Hill, Joseph DIAGNOSIS:  COPD exacerbation ([place]) [J44.1]  DISCHARGE DIAGNOSIS:  Active Problems:   COPD exacerbation ([place])   SECONDARY DIAGNOSIS:   Past Medical History:  Diagnosis Date  . Arthritis   . Asthma   . Cancer ([place])    ovarian  . Diabetes mellitus without complication ([place])   . Hypertension   . Seizures ([place])      ADMITTING HISTORY  HISTORY OF PRESENT ILLNESS:  Jennifer Weiss  is a 56 y.o. female with a known history of COPD, diabetes, hypertension, tobacco use presents to the emergency room complaining of cough and shortness of breath of 2 weeks which worsened acutely today morning and had to come to the emergency room. Patient continues to smoke. Had inhalers that were lost when she moved. She also tells me that she cannot read and does not check her blood sugars. Overall she doesn't keep up with her health care visits her medications at well. Here in the emergency room patient desatted to less than 88% on room air. Has acute wheezing in spite of IV steroids and multiple nebulizer treatment. Patient is being admitted for COPD exacerbation and acute hypoxic respiratory failure. Patient was admitted the last time here in January 2018 when she had similar exacerbation with influenza.   HOSPITAL COURSE:   * Acute COPD exacerbation with acute hypoxic respiratory failure Patient treated with IV steroids, scheduled nebulizers and antibiotics. Patient improved well. Was weaned off oxygen on day 2 of admission. Patient continued to have wheezing and some shortness of breath on ambulation. But she thinks she is improving well and has requested to be discharged home. She has been  counseled to quit smoking. Patient will follow-up with her primary care physician within a week. She has been given prescriptions for prednisone, antibiotics and a nebulizer machine.  Diabetes mellitus was uncontrolled during the hospital today due to steroids. But covered with sliding scale insulin.  Other comorbidities remained stable. Stable for discharge home.  CONSULTS OBTAINED:    DRUG ALLERGIES:   Allergies  Allergen Reactions  . Nystatin Other (See Comments)    Reaction: unknown    DISCHARGE MEDICATIONS:   Discharge Medication List as of 05/30/2017 10:35 AM    START taking these medications   Details  guaiFENesin-dextromethorphan (ROBITUSSIN DM) 100-10 MG/5ML syrup Take 5 mLs by mouth every 4 (four) hours as needed for cough., Starting Sat 05/30/2017, Print    ipratropium-albuterol (DUONEB) 0.5-2.5 (3) MG/3ML SOLN Take 3 mLs by nebulization every 4 (four) hours as needed (Wheezing or shortness of breath)., Starting Sat 05/30/2017, Print      CONTINUE these medications which have CHANGED   Details  predniSONE (DELTASONE) 50 MG tablet Take 1 tablet (50 mg total) by mouth daily with breakfast., Starting Sun 05/31/2017, Print      CONTINUE these medications which have NOT CHANGED   Details  acyclovir (ZOVIRAX) 800 MG tablet Take 800 mg by mouth daily., Historical Med    atenolol-chlorthalidone (TENORETIC) 100-25 MG tablet Take 1 tablet by mouth daily., Historical Med    atorvastatin (LIPITOR) 40 MG tablet Take 40 mg by mouth daily., Historical Med    FLUoxetine (PROZAC) 10 MG capsule  Take 10 mg by mouth daily., Historical Med    fluticasone (FLOVENT HFA) 110 MCG/ACT inhaler Inhale 2 puffs into the lungs 2 (two) times daily., Historical Med    gabapentin (NEURONTIN) 300 MG capsule Take 900 mg by mouth 3 (three) times daily., Historical Med    ipratropium (ATROVENT HFA) 17 MCG/ACT inhaler Inhale 2 puffs into the lungs 2 (two) times daily., Historical Med     levETIRAcetam (KEPPRA) 750 MG tablet Take 1 tablet (750 mg total) by mouth 2 (two) times daily., Starting Sun 02/15/2017, Print    metFORMIN (GLUCOPHAGE) 500 MG tablet Take 500 mg by mouth 2 (two) times daily with a meal., Historical Med    albuterol (PROVENTIL HFA;VENTOLIN HFA) 108 (90 Base) MCG/ACT inhaler Inhale 2 puffs into the lungs every 4 (four) hours as needed for wheezing or shortness of breath., Historical Med      STOP taking these medications     oseltamivir (TAMIFLU) 30 MG capsule         Today   VITAL SIGNS:  Blood pressure (!) 160/76, pulse 74, temperature 98.2 F (36.8 C), temperature source Oral, resp. rate 20, height 5\' 6"  (1.676 m), weight 98.1 kg (216 lb 4.8 oz), SpO2 95 %.  I/O:  No intake or output data in the 24 hours ending 06/02/17 1122  PHYSICAL EXAMINATION:  Physical Exam  GENERAL:  56 y.o.-year-old patient lying in the bed with no acute distress.  LUNGS: Normal breath sounds bilaterally, no wheezing, rales,rhonchi or crepitation. No use of accessory muscles of respiration.  CARDIOVASCULAR: S1, S2 normal. No murmurs, rubs, or gallops.  ABDOMEN: Soft, non-tender, non-distended. Bowel sounds present. No organomegaly or mass.  NEUROLOGIC: Moves all 4 extremities. PSYCHIATRIC: The patient is alert and oriented x 3.  SKIN: No obvious rash, lesion, or ulcer.   DATA REVIEW:   CBC  Recent Labs Lab 05/30/17 0414  WBC 11.0  HGB 13.4  HCT 40.4  PLT 160    Chemistries   Recent Labs Lab 05/29/17 1442 05/30/17 0414  NA 140 141  K 2.7* 3.6  CL 102 104  CO2 27 25  GLUCOSE 122* 284*  BUN 12 17  CREATININE 0.86 0.92  CALCIUM 9.2 9.4  AST 37  --   ALT 27  --   ALKPHOS 103  --   BILITOT 0.7  --     Cardiac Enzymes  Recent Labs Lab 05/29/17 [place] <0.03    Microbiology Results  No results found for this or any previous visit.  RADIOLOGY:  No results found.  Follow up with PCP in 1 week.  Management plans discussed with  the patient, family and they are in agreement.  CODE STATUS:  Code Status History    Date Active Date Inactive Code Status Order ID Comments User Context   05/29/2017  3:45 PM 05/30/2017  5:09 PM Full Code [id number]  Hillary Bow, MD ED   12/31/2016  7:44 AM 01/02/2017  3:19 PM Full Code [id number]  Harrie Foreman, MD Inpatient    Advance Directive Documentation     Most Recent Value  Type of Advance Directive  Living will  Pre-existing out of facility DNR order (yellow form or pink MOST form)  -  "MOST" Form in Place?  -      TOTAL TIME TAKING CARE OF THIS PATIENT ON DAY OF DISCHARGE: more than 30 minutes.   Hillary Bow R M.D on 06/02/2017 at 11:22 AM  Between 7am to 6pm - Pager -  [phone]  After 6pm go to [email or web address] - password [email or web address] [facility]  Office  [phone]  CC: Primary care physician; [place]  Note: This dictation was prepared with Dragon dictation along with smaller phrase technology. Any transcriptional errors that result from this process are unintentional.

## 2017-09-17 ENCOUNTER — Observation Stay
Admission: EM | Admit: 2017-09-17 | Discharge: 2017-09-20 | Disposition: A | Discharge status: Home / Self Care | Payer: Medicare Other | Attending: Internal Medicine | Admitting: Internal Medicine

## 2017-09-17 ENCOUNTER — Encounter: Payer: Self-pay | Admitting: *Deleted

## 2017-09-17 ENCOUNTER — Emergency Department: Payer: Medicare Other

## 2017-09-17 DIAGNOSIS — E876 Hypokalemia: Secondary | ICD-10-CM | POA: Diagnosis not present

## 2017-09-17 DIAGNOSIS — R4020 Unspecified coma: Secondary | ICD-10-CM | POA: Diagnosis present

## 2017-09-17 DIAGNOSIS — J449 Chronic obstructive pulmonary disease, unspecified: Secondary | ICD-10-CM | POA: Insufficient documentation

## 2017-09-17 DIAGNOSIS — Z8673 Personal history of transient ischemic attack (TIA), and cerebral infarction without residual deficits: Secondary | ICD-10-CM | POA: Insufficient documentation

## 2017-09-17 DIAGNOSIS — E119 Type 2 diabetes mellitus without complications: Secondary | ICD-10-CM | POA: Insufficient documentation

## 2017-09-17 DIAGNOSIS — E785 Hyperlipidemia, unspecified: Secondary | ICD-10-CM | POA: Diagnosis not present

## 2017-09-17 DIAGNOSIS — R9431 Abnormal electrocardiogram [ECG] [EKG]: Secondary | ICD-10-CM

## 2017-09-17 DIAGNOSIS — G40909 Epilepsy, unspecified, not intractable, without status epilepticus: Secondary | ICD-10-CM | POA: Insufficient documentation

## 2017-09-17 DIAGNOSIS — R079 Chest pain, unspecified: Secondary | ICD-10-CM

## 2017-09-17 DIAGNOSIS — R55 Syncope and collapse: Secondary | ICD-10-CM | POA: Diagnosis not present

## 2017-09-17 DIAGNOSIS — Z79899 Other long term (current) drug therapy: Secondary | ICD-10-CM | POA: Insufficient documentation

## 2017-09-17 DIAGNOSIS — Z7984 Long term (current) use of oral hypoglycemic drugs: Secondary | ICD-10-CM | POA: Diagnosis not present

## 2017-09-17 DIAGNOSIS — F17210 Nicotine dependence, cigarettes, uncomplicated: Secondary | ICD-10-CM | POA: Insufficient documentation

## 2017-09-17 DIAGNOSIS — Z888 Allergy status to other drugs, medicaments and biological substances status: Secondary | ICD-10-CM | POA: Insufficient documentation

## 2017-09-17 DIAGNOSIS — R1084 Generalized abdominal pain: Secondary | ICD-10-CM | POA: Diagnosis not present

## 2017-09-17 DIAGNOSIS — Z8543 Personal history of malignant neoplasm of ovary: Secondary | ICD-10-CM | POA: Diagnosis not present

## 2017-09-17 DIAGNOSIS — R001 Bradycardia, unspecified: Secondary | ICD-10-CM | POA: Insufficient documentation

## 2017-09-17 DIAGNOSIS — M1990 Unspecified osteoarthritis, unspecified site: Secondary | ICD-10-CM | POA: Diagnosis not present

## 2017-09-17 DIAGNOSIS — Z7982 Long term (current) use of aspirin: Secondary | ICD-10-CM | POA: Insufficient documentation

## 2017-09-17 DIAGNOSIS — F331 Major depressive disorder, recurrent, moderate: Secondary | ICD-10-CM | POA: Insufficient documentation

## 2017-09-17 DIAGNOSIS — I10 Essential (primary) hypertension: Secondary | ICD-10-CM | POA: Insufficient documentation

## 2017-09-17 DIAGNOSIS — Z7951 Long term (current) use of inhaled steroids: Secondary | ICD-10-CM | POA: Diagnosis not present

## 2017-09-17 DIAGNOSIS — R569 Unspecified convulsions: Secondary | ICD-10-CM

## 2017-09-17 HISTORY — DX: Disorder of arteries and arterioles, unspecified: I77.9

## 2017-09-17 HISTORY — DX: Tobacco use: Z72.0

## 2017-09-17 HISTORY — DX: Personal history of transient ischemic attack (TIA), and cerebral infarction without residual deficits: Z86.73

## 2017-09-17 HISTORY — DX: Peripheral vascular disease, unspecified: I73.9

## 2017-09-17 LAB — BASIC METABOLIC PANEL
Anion gap: 10 (ref 5–15)
BUN: 15 mg/dL (ref 6–20)
CO2: 28 mmol/L (ref 22–32)
Calcium: 9 mg/dL (ref 8.9–10.3)
Chloride: 107 mmol/L (ref 101–111)
Creatinine, Ser: 0.91 mg/dL (ref 0.44–1.00)
GFR calc non Af Amer: >60 mL/min (ref >60)
Glucose, Bld: 113 mg/dL — ABNORMAL HIGH (ref 65–99)
POTASSIUM: 3.1 mmol/L — AB (ref 3.5–5.1)
SODIUM: 145 mmol/L (ref 135–145)

## 2017-09-17 LAB — CBC
HEMATOCRIT: 35.8 % (ref 35.0–47.0)
HEMOGLOBIN: 12.4 g/dL (ref 12.0–16.0)
MCH: 30.8 pg (ref 26.0–34.0)
MCHC: 34.6 g/dL (ref 32.0–36.0)
MCV: 89 fL (ref 80.0–100.0)
PLATELETS: 176 10*3/uL (ref 150–440)
RBC: 4.02 MIL/uL (ref 3.80–5.20)
RDW: 15.5 % — ABNORMAL HIGH (ref 11.5–14.5)
WBC: 8.7 10*3/uL (ref 3.6–11.0)

## 2017-09-17 LAB — HEPATIC FUNCTION PANEL
ALT: 17 U/L (ref 14–54)
AST: 22 U/L (ref 15–41)
Albumin: 3.4 g/dL — ABNORMAL LOW (ref 3.5–5.0)
Alkaline Phosphatase: 109 U/L (ref 38–126)
Total Bilirubin: 0.4 mg/dL (ref 0.3–1.2)
Total Protein: 6.5 g/dL (ref 6.5–8.1)

## 2017-09-17 LAB — TROPONIN I

## 2017-09-17 LAB — LIPASE, BLOOD: LIPASE: 24 U/L (ref 11–51)

## 2017-09-17 MED ORDER — POTASSIUM CHLORIDE 10 MEQ/100ML IV SOLN
10.0000 meq | Freq: Once | INTRAVENOUS | Status: DC
  Filled 2017-09-17: qty 100

## 2017-09-17 MED ORDER — AMMONIA AROMATIC IN INHA
RESPIRATORY_TRACT | Status: AC
  Filled 2017-09-17: qty 10

## 2017-09-17 MED ORDER — KETOROLAC TROMETHAMINE 30 MG/ML IJ SOLN
15.0000 mg | Freq: Once | INTRAMUSCULAR | Status: AC
  Administered 2017-09-17: 15 mg via INTRAVENOUS
  Filled 2017-09-17: qty 1

## 2017-09-17 MED ORDER — IOPAMIDOL (ISOVUE-370) INJECTION 76%
100.0000 mL | Freq: Once | INTRAVENOUS | Status: AC | PRN
  Administered 2017-09-17: 100 mL via INTRAVENOUS

## 2017-09-17 MED ORDER — MAGNESIUM SULFATE 2 GM/50ML IV SOLN
2.0000 g | Freq: Once | INTRAVENOUS | Status: AC
  Administered 2017-09-18: 2 g via INTRAVENOUS
  Filled 2017-09-17: qty 50

## 2017-09-17 MED ORDER — POTASSIUM CHLORIDE CRYS ER 20 MEQ PO TBCR
40.0000 meq | EXTENDED_RELEASE_TABLET | Freq: Once | ORAL | Status: AC
  Administered 2017-09-18: 40 meq via ORAL
  Filled 2017-09-17: qty 2

## 2017-09-17 NOTE — ED Notes (Signed)
Pt was talking to RN and nurse tech and had a moment of difficulty speaking and then had a fixed stare at staff member. PT was no longer answering questions. Activity did not appear seizure like in nature but pt would not respond and would not break stare. RN sternal rubbed pt and pt turned head to look at RN but continued to have a wide eyed stare. RN asked if pt was having trouble communicating and pts eye grew larger and pt began to tear up. Pt eventually was able to answer what her name was. MD has been made aware.    Pt also having bilateral arm weakness and was not following commands.

## 2017-09-17 NOTE — ED Notes (Signed)
RN and MD at bedside. Pt and patient's family in room. Pt is tearful and slow to respond due to tears. Pt able to talk in complete sentences after calming self. Pt shows no neuro deficits and is no longer having difficulty finding words or communicating.

## 2017-09-17 NOTE — ED Provider Notes (Signed)
[facility] Emergency Department Provider Note  ____________________________________________  Time seen: Approximately 9:54 PM  I have reviewed the triage vital signs and the nursing notes.   HISTORY  Chief Complaint Chest Pain and Loss of Consciousness   HPI Jennifer Weiss is a 56 y.o. female with a history of seizure disorder, COPD, diabetes, hypertension, hyperlipidemia, and depression who presents for evaluation of syncope versus seizure episode. According to the family patient has been very depressed. She lost her son and does not have a good relationship with her daughter. Today she was complaining all day of chest pain and abdominal pain. She was coming home and getting out of the car when she started feeling worsening chest pain or shortness of breath. She sat down and had an episode of loss of consciousness. According to the family patient's chest was shaking but it did not look like she was having a full generalized tonic-clonic seizure. The family checked for a pulse and breathing and could not feel one and CPR was done for 5-7 minutes. When EMS arrived the patient had a pulse and was breathing. Unclear if patient was postictal. No urinary or bowel loss. Patient is not on any antiseizure medications. Patient is complaining of severe, constant, sharp chest pain that has been present since this morning associated with shortness of breath. Her sister has a history of blood clots and she is concerned she might have one. No leg pain or swelling, no hemoptysis, no recent travel or immobilization, no leg pain or swelling. Patient is also complaining of generalized sharp moderate to severe abdominal pain that has been present since yesterday after she underwent a colonoscopy. No fever or chills, no diarrhea or constipation, no dysuria or hematuria. Patient is very upset, crying, flat affect, will respond selectively to questions. She denies SI or HI  Past Medical  History:  Diagnosis Date  . Arthritis   . Asthma   . Cancer ([place])    ovarian  . Diabetes mellitus without complication ([place])   . Hypertension   . Seizures [facility])     Patient Active Problem List   Diagnosis Date Noted  . Chest pain 09/17/2017  . Loss of consciousness ([place]) 09/17/2017  . Seizures ([place]) 09/17/2017  . Diabetes ([place]) 09/17/2017  . HTN (hypertension) 09/17/2017  . COPD exacerbation ([place]) 05/29/2017  . Hypoxia 12/31/2016    Past Surgical History:  Procedure Laterality Date  . none      Prior to Admission medications   Medication Sig Start Date End Date Taking? Authorizing Provider  acyclovir (ZOVIRAX) 800 MG tablet Take 800 mg by mouth daily.   Yes [provider]  albuterol (PROVENTIL HFA;VENTOLIN HFA) 108 (90 Base) MCG/ACT inhaler Inhale 2 puffs into the lungs every 4 (four) hours as needed for wheezing or shortness of breath.   Yes [provider]  ASPIR-LOW 81 MG EC tablet Take 81 mg by mouth daily.   Yes [provider]  atenolol-chlorthalidone (TENORETIC) 100-25 MG tablet Take 1 tablet by mouth daily.   Yes [provider]  atorvastatin (LIPITOR) 40 MG tablet Take 40 mg by mouth daily.   Yes [provider]  budesonide-formoterol (SYMBICORT) 160-4.5 MCG/ACT inhaler Inhale 2 puffs into the lungs 2 (two) times daily.   Yes [provider]  FLUoxetine (PROZAC) 10 MG capsule Take 10 mg by mouth daily.   Yes [provider]  fluticasone (FLOVENT HFA) 110 MCG/ACT inhaler Inhale 2 puffs into the lungs 2 (two) times  daily.   Yes [provider]  gabapentin (NEURONTIN) 300 MG capsule Take 900 mg by mouth 3 (three) times daily.   Yes [provider]  ipratropium (ATROVENT HFA) 17 MCG/ACT inhaler Inhale 2 puffs into the lungs 2 (two) times daily.   Yes [provider]  ipratropium-albuterol (DUONEB) 0.5-2.5 (3) MG/3ML SOLN Take 3 mLs by nebulization every 4 (four) hours as needed  (Wheezing or shortness of breath). 05/30/17  Yes Sudini, Alveta Heimlich, MD  metFORMIN (GLUCOPHAGE) 500 MG tablet Take 500 mg by mouth 2 (two) times daily with a meal.   Yes [provider]  guaiFENesin-dextromethorphan (ROBITUSSIN DM) 100-10 MG/5ML syrup Take 5 mLs by mouth every 4 (four) hours as needed for cough. Patient not taking: Reported on 09/17/2017 05/30/17   Hillary Bow, MD    Allergies Nystatin  Family History  Problem Relation Age of Onset  . Diabetes Mellitus II Mother     Social History Social History  Substance Use Topics  . Smoking status: Current Every Day Smoker    Packs/day: 0.50    Types: Cigarettes  . Smokeless tobacco: Never Used  . Alcohol use No    Review of Systems  Constitutional: Negative for fever. + unresponsiveness Eyes: Negative for visual changes. ENT: Negative for sore throat. Neck: No neck pain  Cardiovascular: + chest pain. Respiratory: + shortness of breath. Gastrointestinal: + abdominal pain.  normally with his takennegative vomiting or diarrhea. Genitourinary: Negative for dysuria. Musculoskeletal: Negative for back pain. Skin: Negative for rash. Neurological: Negative for headaches, weakness or numbness. Psych: No SI or HI  ____________________________________________   PHYSICAL EXAM:  VITAL SIGNS: ED Triage Vitals [09/17/17 2100]  Enc Vitals Group     BP 140/75     Pulse Rate 67     Resp (!) 23     Temp 98.4 F (36.9 C)     Temp Source Oral     SpO2 95 %     Weight 213 lb (96.6 kg)     Height 5\' 5"  (1.651 m)     Head Circumference      Peak Flow      Pain Score      Pain Loc      Pain Edu?      Excl. in [place]?     Constitutional: Alert and oriented, crying, blunt affect, answers to questions selectively, had two episodes where patient stares and does not respond to sternal rub each lasting a few seconds.  HEENT:      Head: Normocephalic and atraumatic.         Eyes: Conjunctivae are normal. Sclera is  non-icteric.       Mouth/Throat: Mucous membranes are moist.       Neck: Supple with no signs of meningismus. Cardiovascular: Regular rate and rhythm. No murmurs, gallops, or rubs. 2+ symmetrical distal pulses are present in all extremities. No JVD. Significant ttp over the anterior chest wall Respiratory: Normal respiratory effort. Lungs are clear to auscultation bilaterally. No wheezes, crackles, or rhonchi.  Gastrointestinal: Obese, diffusely ttp non tender, and non distended with positive bowel sounds. No rebound or guarding. Musculoskeletal: Nontender with normal range of motion in all extremities. No edema, cyanosis, or erythema of extremities. Neurologic: Normal speech and language. Face is symmetric. Moving all extremities. No gross focal neurologic deficits are appreciated. Skin: Skin is warm, dry and intact. No rash noted. Psychiatric: Mood and affect are blunt.  ____________________________________________   LABS (all labs ordered are listed, but  only abnormal results are displayed)  Labs Reviewed  BASIC METABOLIC PANEL - Abnormal; Notable for the following:       Result Value   Potassium 3.1 (*)    Glucose, Bld 113 (*)    All other components within normal limits  CBC - Abnormal; Notable for the following:    RDW 15.5 (*)    All other components within normal limits  HEPATIC FUNCTION PANEL - Abnormal; Notable for the following:    Albumin 3.4 (*)    Bilirubin, Direct <0.1 (*)    All other components within normal limits  LIPASE, BLOOD  TROPONIN I  URINALYSIS, COMPLETE (UACMP) WITH MICROSCOPIC   ____________________________________________  EKG  ED ECG REPORT I, Rudene Re, the attending physician, personally viewed and interpreted this ECG.  Normal sinus rhythm, rate of 64, normal PR and QRS, prolonged QTC, normal axis, no ST elevations or depressions, flat T waves diffusely  ____________________________________________  RADIOLOGY  CT head: 1. No acute  intracranial process. 2. 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 Small cerebellar infarcts. 3. Mild to moderate chronic small vessel ischemic disease. 4. LEFT inferior frontal lobe encephalomalacia is likely posttraumatic.    CT chest/a/p: IMPRESSION: CTA CHEST:  1. No acute pulmonary embolism. 2. Mild cardiomegaly and mild pulmonary edema.  CT ABDOMEN AND PELVIS:  1. No acute abdominopelvic process. 2. Grade 1 L5-S1 anterolisthesis on degenerative basis with severe bilateral L5-S1 neural foraminal narrowing.  Aortic Atherosclerosis (ICD10-I70.0). ____________________________________________   PROCEDURES  Procedure(s) performed: None Procedures Critical Care performed: yes  CRITICAL CARE Performed by: Rudene Re  ?  Total critical care time: 40 min  Critical care time was exclusive of separately billable procedures and treating other patients.  Critical care was necessary to treat or prevent imminent or life-threatening deterioration.  Critical care was time spent personally by me on the following activities: development of treatment plan with patient and/or surrogate as well as nursing, discussions with consultants, evaluation of patient's response to treatment, examination of patient, obtaining history from patient or surrogate, ordering and performing treatments and interventions, ordering and review of laboratory studies, ordering and review of radiographic studies, pulse oximetry and re-evaluation of patient's condition.  ____________________________________________   INITIAL IMPRESSION / ASSESSMENT AND PLAN / ED COURSE  56 y.o. female with a history of seizure disorder, COPD, diabetes, hypertension, hyperlipidemia, and depression who presents for evaluation of syncope versus seizure episode. Patient received CPR from family for 5-7 min. Patient does have very unusual behavior in the ED with several episodes of starring and not responding to sternal  rub, no generalized tonic clonic movement seen. CT head with no evidence of acute stroke. Neuro intact. Possibly having seizures although no post-ictal state. Patient has prolonged QTc which could also be a cause of her syncope. Needs cardiology evaluation. Psych also possible. Plan for admission.  Complaining of CP and abdominal pain before this episode. Fh of PE/DVT. Chest wall is tender and pain is reproducible. Ddx for chest pain includes PE, ACS, PNA, post-CPR pain, rib fx. Will get CTA chest. EKG with no ischemia. No evidence of COPD exacerbation. Ddx for abdominal pain includes diverticulitis vs colitis vs complication from colonoscopy. CT a/p pending.    _________________________ 11:56 PM on 09/17/2017 -----------------------------------------  CT c/a/p negative for acute findings other than mild cardiomegaly and pulmonary edema. Patient has no h/o CHF. Will need echocardiogram. On telemetry, needs cardiology evaluation for syncope with prolonged QTc. K and Mag given to optimize electrolytes in the  setting of possible lethal dysrhythmia. Hospitalist consulted for admission.  Pertinent labs & imaging results that were available during my care of the patient were reviewed by me and considered in my medical decision making (see chart for details).    ____________________________________________   FINAL CLINICAL IMPRESSION(S) / ED DIAGNOSES  Final diagnoses:  Syncope, unspecified syncope type  Prolonged Q-T interval on ECG  Chest pain, unspecified type  Generalized abdominal pain      NEW MEDICATIONS STARTED DURING THIS VISIT:  New Prescriptions   No medications on file     Note:  This document was prepared using Dragon voice recognition software and may include unintentional dictation errors.    Rudene Re, MD 09/17/17 [phone]

## 2017-09-17 NOTE — ED Notes (Signed)
PT had second episode where family came to nurse and reported pt "has passed out." Upon entering the room pt is staring at the wall and not responding to RN. Pt did not respond to sternal rub. Pt did note follow command or answer RN. After one minute the patient began to sob and started crying again. Pt denies memory of event. MD at bedside with pt and family.

## 2017-09-17 NOTE — ED Notes (Signed)
Pt back in room from CT. NAD noted. PT is responsive and following commands. Pt in NAD at this time.

## 2017-09-17 NOTE — ED Triage Notes (Signed)
Pt to ED via EMS reporting centralized chest pain worsening throughout the day and radiating to back. Pt reports SOB and had syncopal episode this evening. EMS reports family began CPR but pt was breathing upon EMS arrival. PT reports having no memory of family performing CPR.   Pt reports increased stressed with daughter not talking to her and 4 year anniversary of son's death. PT would not answer if she had used drugs this evening.   Pt reports abd cramping throughout the day today. No NVD reports but abd tenderness upon assessment.

## 2017-09-17 NOTE — ED Notes (Signed)
Patient transported to CT 

## 2017-09-18 ENCOUNTER — Observation Stay (HOSPITAL_BASED_OUTPATIENT_CLINIC_OR_DEPARTMENT_OTHER): Payer: Medicare Other

## 2017-09-18 ENCOUNTER — Observation Stay: Payer: Medicare Other

## 2017-09-18 ENCOUNTER — Observation Stay (HOSPITAL_BASED_OUTPATIENT_CLINIC_OR_DEPARTMENT_OTHER)
Admit: 2017-09-18 | Discharge: 2017-09-18 | Disposition: A | Discharge status: Home / Self Care | Payer: Medicare Other | Attending: Internal Medicine | Admitting: Internal Medicine

## 2017-09-18 ENCOUNTER — Encounter: Payer: Self-pay | Admitting: Surgery

## 2017-09-18 DIAGNOSIS — R55 Syncope and collapse: Secondary | ICD-10-CM

## 2017-09-18 DIAGNOSIS — R4020 Unspecified coma: Secondary | ICD-10-CM

## 2017-09-18 DIAGNOSIS — R9431 Abnormal electrocardiogram [ECG] [EKG]: Secondary | ICD-10-CM

## 2017-09-18 DIAGNOSIS — I340 Nonrheumatic mitral (valve) insufficiency: Secondary | ICD-10-CM

## 2017-09-18 DIAGNOSIS — R404 Transient alteration of awareness: Secondary | ICD-10-CM | POA: Diagnosis not present

## 2017-09-18 DIAGNOSIS — R569 Unspecified convulsions: Secondary | ICD-10-CM | POA: Diagnosis not present

## 2017-09-18 DIAGNOSIS — R079 Chest pain, unspecified: Secondary | ICD-10-CM

## 2017-09-18 LAB — BASIC METABOLIC PANEL
ANION GAP: 9 (ref 5–15)
BUN: 13 mg/dL (ref 6–20)
CHLORIDE: 106 mmol/L (ref 101–111)
CO2: 28 mmol/L (ref 22–32)
CREATININE: 0.85 mg/dL (ref 0.44–1.00)
Calcium: 8.8 mg/dL — ABNORMAL LOW (ref 8.9–10.3)
GFR calc non Af Amer: >60 mL/min (ref >60)
Glucose, Bld: 112 mg/dL — ABNORMAL HIGH (ref 65–99)
POTASSIUM: 3.6 mmol/L (ref 3.5–5.1)
SODIUM: 143 mmol/L (ref 135–145)

## 2017-09-18 LAB — HEMOGLOBIN A1C
Hgb A1c MFr Bld: 6 % — ABNORMAL HIGH (ref 4.8–5.6)
Mean Plasma Glucose: 125.5 mg/dL

## 2017-09-18 LAB — TSH: TSH: 0.515 u[IU]/mL (ref 0.350–4.500)

## 2017-09-18 LAB — LIPID PANEL
CHOL/HDL RATIO: 4.2 ratio
CHOLESTEROL: 143 mg/dL (ref 0–200)
HDL: 34 mg/dL — AB (ref >40)
LDL Cholesterol: 92 mg/dL (ref 0–99)
TRIGLYCERIDES: 87 mg/dL (ref <150)
VLDL: 17 mg/dL (ref 0–40)

## 2017-09-18 LAB — CBC
HCT: 37.2 % (ref 35.0–47.0)
Hemoglobin: 12.4 g/dL (ref 12.0–16.0)
MCH: 30.5 pg (ref 26.0–34.0)
MCHC: 33.3 g/dL (ref 32.0–36.0)
MCV: 91.5 fL (ref 80.0–100.0)
PLATELETS: 150 10*3/uL (ref 150–440)
RBC: 4.07 MIL/uL (ref 3.80–5.20)
RDW: 15.9 % — AB (ref 11.5–14.5)
WBC: 5.5 10*3/uL (ref 3.6–11.0)

## 2017-09-18 LAB — GLUCOSE, CAPILLARY
GLUCOSE-CAPILLARY: 91 mg/dL (ref 65–99)
Glucose-Capillary: 98 mg/dL (ref 65–99)
Glucose-Capillary: 87 mg/dL (ref 65–99)
Glucose-Capillary: 137 mg/dL — ABNORMAL HIGH (ref 65–99)

## 2017-09-18 LAB — TROPONIN I
Troponin I: <0.03 ng/mL (ref <0.03)
Troponin I: <0.03 ng/mL (ref <0.03)

## 2017-09-18 MED ORDER — ASPIRIN EC 81 MG PO TBEC
81.0000 mg | DELAYED_RELEASE_TABLET | Freq: Every day | ORAL | Status: DC
  Administered 2017-09-18 – 2017-09-20 (×3): 81 mg via ORAL
  Filled 2017-09-18 (×3): qty 1

## 2017-09-18 MED ORDER — ACYCLOVIR 200 MG PO CAPS
800.0000 mg | ORAL_CAPSULE | Freq: Every day | ORAL | Status: DC
  Administered 2017-09-18 – 2017-09-20 (×3): 800 mg via ORAL
  Filled 2017-09-18 (×3): qty 4

## 2017-09-18 MED ORDER — INSULIN ASPART 100 UNIT/ML ~~LOC~~ SOLN: 0.0000 [IU] | Freq: Every day | SUBCUTANEOUS | Status: DC

## 2017-09-18 MED ORDER — ACETAMINOPHEN 325 MG PO TABS: 650.0000 mg | ORAL_TABLET | Freq: Four times a day (QID) | ORAL | Status: DC | PRN

## 2017-09-18 MED ORDER — ENOXAPARIN SODIUM 40 MG/0.4ML ~~LOC~~ SOLN
40.0000 mg | SUBCUTANEOUS | Status: DC
  Administered 2017-09-18 – 2017-09-19 (×2): 40 mg via SUBCUTANEOUS
  Filled 2017-09-18 (×2): qty 0.4

## 2017-09-18 MED ORDER — ATORVASTATIN CALCIUM 20 MG PO TABS
40.0000 mg | ORAL_TABLET | Freq: Every day | ORAL | Status: DC
  Administered 2017-09-18 – 2017-09-20 (×3): 40 mg via ORAL
  Filled 2017-09-18 (×3): qty 2

## 2017-09-18 MED ORDER — INSULIN ASPART 100 UNIT/ML ~~LOC~~ SOLN
0.0000 [IU] | Freq: Three times a day (TID) | SUBCUTANEOUS | Status: DC
  Administered 2017-09-19: 2 [IU] via SUBCUTANEOUS
  Filled 2017-09-18: qty 1

## 2017-09-18 MED ORDER — LORAZEPAM 2 MG/ML IJ SOLN
0.5000 mg | Freq: Once | INTRAMUSCULAR | Status: AC
  Administered 2017-09-18: 0.5 mg via INTRAVENOUS
  Filled 2017-09-18: qty 1

## 2017-09-18 MED ORDER — ACYCLOVIR 800 MG PO TABS
800.0000 mg | ORAL_TABLET | Freq: Every day | ORAL | Status: DC
  Filled 2017-09-18: qty 1

## 2017-09-18 MED ORDER — ACETAMINOPHEN 650 MG RE SUPP: 650.0000 mg | Freq: Four times a day (QID) | RECTAL | Status: DC | PRN

## 2017-09-18 MED ORDER — PROCHLORPERAZINE EDISYLATE 5 MG/ML IJ SOLN
5.0000 mg | Freq: Four times a day (QID) | INTRAMUSCULAR | Status: DC | PRN
  Filled 2017-09-18: qty 1

## 2017-09-18 NOTE — Care Management (Signed)
Patient placed in observation from home for sx concerning for seizure and chest pain. Work up thus far without acute findings.  has a walker and home nebulizer which was provided at time of last discharge June 2018.  No discharge needs identified by members of the care team at present

## 2017-09-18 NOTE — Progress Notes (Signed)
Patient seen and evaluated. Chart reviewed.  Agree with plan as ordered by admitting physician  I will request EEG and psychiatry consultation  I am suspecting that most of her symptoms are due to her severe depression.   Discussed plan with daughter who is at bedside.

## 2017-09-18 NOTE — Consult Note (Addendum)
Reason for Consult:Starring spells Referring Physician: Mody  CC: Starring spells  HPI: Jennifer Weiss is an 56 y.o. female who reports that she does not know why she is in the hospital and has no complaints. Family not available at the time of evaluation.  Chart reports that on presentation patient complained of chest pain.  Had a syncopal episode at home.  CPR was briefly started.  When EMS arrived patient had a pulse.  In ED was noted to have some starring spells.  Patient reports that she sees a neurologist and has a history of seizures.  Reports she is on medication for her seizures but does not know the name.  Reports she has been told that they are brought on by stress and currently she is under a lot of stress.  Describes them as the shaking kind of seizures.    Past Medical History:  Diagnosis Date  . Arthritis   . Asthma   . Cancer ([place])    ovarian  . Diabetes mellitus without complication ([place])   . Hypertension   . Seizures ([place])     Past Surgical History:  Procedure Laterality Date  . none      Family History  Problem Relation Age of Onset  . Diabetes Mellitus II Mother     Social History:  reports that she has been smoking Cigarettes.  She has been smoking about 0.50 packs per day. She has never used smokeless tobacco. She reports that she does not drink alcohol. Her drug history is not on file.  Allergies  Allergen Reactions  . Nystatin Other (See Comments)    Reaction: unknown    Medications:  I have reviewed the patient's current medications. Prior to Admission:  Prescriptions Prior to Admission  Medication Sig Dispense Refill Last Dose  . acyclovir (ZOVIRAX) 800 MG tablet Take 800 mg by mouth daily.   09/17/2017 at Unknown time  . albuterol (PROVENTIL HFA;VENTOLIN HFA) 108 (90 Base) MCG/ACT inhaler Inhale 2 puffs into the lungs every 4 (four) hours as needed for wheezing or shortness of breath.   prn at prn  . ASPIR-LOW 81 MG EC tablet Take 81 mg by mouth  daily.     Marland Kitchen atenolol-chlorthalidone (TENORETIC) 100-25 MG tablet Take 1 tablet by mouth daily.   09/17/2017 at Unknown time  . atorvastatin (LIPITOR) 40 MG tablet Take 40 mg by mouth daily.   09/17/2017 at Unknown time  . budesonide-formoterol (SYMBICORT) 160-4.5 MCG/ACT inhaler Inhale 2 puffs into the lungs 2 (two) times daily.   09/17/2017 at Unknown time  . FLUoxetine (PROZAC) 10 MG capsule Take 10 mg by mouth daily.   09/17/2017 at Unknown time  . fluticasone (FLOVENT HFA) 110 MCG/ACT inhaler Inhale 2 puffs into the lungs 2 (two) times daily.   09/17/2017 at Unknown time  . gabapentin (NEURONTIN) 300 MG capsule Take 900 mg by mouth 3 (three) times daily.   09/17/2017 at Unknown time  . ipratropium (ATROVENT HFA) 17 MCG/ACT inhaler Inhale 2 puffs into the lungs 2 (two) times daily.   05/28/2017 at Unknown time  . ipratropium-albuterol (DUONEB) 0.5-2.5 (3) MG/3ML SOLN Take 3 mLs by nebulization every 4 (four) hours as needed (Wheezing or shortness of breath). 360 mL 0   . metFORMIN (GLUCOPHAGE) 500 MG tablet Take 500 mg by mouth 2 (two) times daily with a meal.   09/17/2017 at Unknown time  . guaiFENesin-dextromethorphan (ROBITUSSIN DM) 100-10 MG/5ML syrup Take 5 mLs by mouth every 4 (four) hours as needed  for cough. (Patient not taking: Reported on 09/17/2017) 118 mL 0 Not Taking at Unknown time   Scheduled: . acyclovir  800 mg Oral Daily  . aspirin EC  81 mg Oral Daily  . atorvastatin  40 mg Oral Daily  . enoxaparin (LOVENOX) injection  40 mg Subcutaneous Q24H  . insulin aspart  0-5 Units Subcutaneous QHS  . insulin aspart  0-9 Units Subcutaneous TID WC    ROS: History obtained from the patient and chart  General ROS: negative for - chills, fatigue, fever, night sweats, weight gain or weight loss Psychological ROS: negative for - behavioral disorder, hallucinations, memory difficulties, mood swings or suicidal ideation Ophthalmic ROS: negative for - blurry vision, double vision, eye pain or  loss of vision ENT ROS: negative for - epistaxis, nasal discharge, oral lesions, sore throat, tinnitus or vertigo Allergy and Immunology ROS: negative for - hives or itchy/watery eyes Hematological and Lymphatic ROS: negative for - bleeding problems, bruising or swollen lymph nodes Endocrine ROS: negative for - galactorrhea, hair pattern changes, polydipsia/polyuria or temperature intolerance Respiratory ROS: negative for - cough, hemoptysis, shortness of breath or wheezing Cardiovascular ROS: chest pain Gastrointestinal ROS: negative for - abdominal pain, diarrhea, hematemesis, nausea/vomiting or stool incontinence Genito-Urinary ROS: negative for - dysuria, hematuria, incontinence or urinary frequency/urgency Musculoskeletal ROS: negative for - joint swelling or muscular weakness Neurological ROS: as noted in HPI Dermatological ROS: negative for rash and skin lesion changes  Physical Examination: Blood pressure (!) 160/85, pulse (!) 49, temperature 97.6 F (36.4 C), temperature source Oral, resp. rate 16, height 5\' 5"  (1.651 m), weight 100.4 kg (221 lb 4.8 oz), SpO2 100 %.  HEENT-  Normocephalic, no lesions, without obvious abnormality.  Normal external eye and conjunctiva.  Normal TM's bilaterally.  Normal auditory canals and external ears. Normal external nose, mucus membranes and septum.  Normal pharynx. Cardiovascular- S1, S2 normal, pulses palpable throughout   Lungs- chest clear, no wheezing, rales, normal symmetric air entry Abdomen- soft, non-tender; bowel sounds normal; no masses,  no organomegaly Extremities- no edema Lymph-no adenopathy palpable Musculoskeletal-no joint tenderness, deformity or swelling Skin-warm and dry, no hyperpigmentation, vitiligo, or suspicious lesions  Neurological Examination   Mental Status: Alert, oriented, thought content appropriate.  Speech fluent without evidence of aphasia.  Able to follow 3 step commands without difficulty. Cranial  Nerves: II: Discs flat bilaterally; Visual fields grossly normal, pupils equal, round, reactive to light and accommodation III,IV, VI: ptosis not present, extra-ocular motions intact bilaterally V,VII: smile symmetric, facial light touch sensation normal bilaterally VIII: hearing normal bilaterally IX,X: gag reflex present XI: bilateral shoulder shrug XII: midline tongue extension Motor: Right : Upper extremity   5/5    Left:     Upper extremity   5/5  Lower extremity   5/5     Lower extremity   5/5 Tone and bulk:normal tone throughout; no atrophy noted Sensory: Pinprick and light touch intact throughout, bilaterally Deep Tendon Reflexes: 2+ and symmetric with absent AJ's bilaterally Plantars: Right: downgoing   Left: downgoing Cerebellar: Normal finger-to-nose and normal heel-to-shin testing bilaterally Gait: not tested due to safety concerns    Laboratory Studies:   Basic Metabolic Panel:  Recent Labs Lab 09/17/17 2102 09/18/17 0811  NA 145 143  K 3.1* 3.6  CL 107 106  CO2 28 28  GLUCOSE 113* 112*  BUN 15 13  CREATININE 0.91 0.85  CALCIUM 9.0 8.8*    Liver Function Tests:  Recent Labs Lab 09/17/17 2102  AST  22  ALT 17  ALKPHOS 109  BILITOT 0.4  PROT 6.5  ALBUMIN 3.4*    Recent Labs Lab 09/17/17 2102  LIPASE 24   No results for input(s): AMMONIA in the last 168 hours.  CBC:  Recent Labs Lab 09/17/17 2102 09/18/17 0811  WBC 8.7 5.5  HGB 12.4 12.4  HCT 35.8 37.2  MCV 89.0 91.5  PLT 176 150    Cardiac Enzymes:  Recent Labs Lab 09/17/17 2102 09/18/17 0201 09/18/17 0811  TROPONINI <0.03 <0.03 <0.03    BNP: Invalid input(s): POCBNP  CBG:  Recent Labs Lab 09/18/17 0725  GLUCAP 52    Microbiology: No results found for this or any previous visit.  Coagulation Studies: No results for input(s): LABPROT, INR in the last 72 hours.  Urinalysis: No results for input(s): COLORURINE, LABSPEC, PHURINE, GLUCOSEU, HGBUR, BILIRUBINUR,  KETONESUR, PROTEINUR, UROBILINOGEN, NITRITE, LEUKOCYTESUR in the last 168 hours.  Invalid input(s): APPERANCEUR  Lipid Panel:  No results found for: CHOL, TRIG, HDL, CHOLHDL, VLDL, LDLCALC  HgbA1C:  Lab Results  Component Value Date   HGBA1C 6.0 (H) 05/29/2017    Urine Drug Screen:  No results found for: LABOPIA, COCAINSCRNUR, LABBENZ, AMPHETMU, THCU, LABBARB  Alcohol Level: No results for input(s): ETH in the last 168 hours.  Other results: EKG: sinus rhythm at 64 bpm.  Imaging: Ct Head Wo Contrast  Result Date: 09/17/2017 CLINICAL DATA:  Centralized chest pain, shortness of breath. Family performed CPR, patient does not recall. History of hypertension and diabetes, ovarian cancer and seizures. EXAM: CT HEAD WITHOUT CONTRAST TECHNIQUE: Contiguous axial images were obtained from the base of the skull through the vertex without intravenous contrast. COMPARISON:  CT HEAD February 15, 2017 FINDINGS: BRAIN: No intraparenchymal hemorrhage, mass effect nor midline shift. Multifocal LEFT frontal encephalomalacia. Small air RIGHT occipital lobe encephalomalacia. Old small bilateral cerebellar infarcts. The ventricles and sulci are normal. Patchy supratentorial Schwalb matter hypodensities. No acute large vascular territory infarcts. No abnormal extra-axial fluid collections. Basal cisterns are patent. VASCULAR: Mild calcific atherosclerosis carotid siphons. SKULL/SOFT TISSUES: No skull fracture. No significant soft tissue swelling. ORBITS/SINUSES: The included ocular globes and orbital contents are normal.The mastoid aircells and included paranasal sinuses are well-aerated. OTHER: None. IMPRESSION: 1. No acute intracranial process. 2. 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 Small cerebellar infarcts. 3. Mild to moderate chronic small vessel ischemic disease. 4. LEFT inferior frontal lobe encephalomalacia is likely posttraumatic. Electronically Signed   By: Elon Alas M.D.   On: 09/17/2017 22:15    Ct Angio Chest Pe W And/or Wo Contrast  Result Date: 09/17/2017 CLINICAL DATA:  Syncopal episode, unresponsive for 1 minutes. Patient does not remember event. Assess for pulmonary embolism. History of ovarian cancer, seizures, hypertension and diabetes. EXAM: CT ANGIOGRAPHY CHEST CT ABDOMEN AND PELVIS WITH CONTRAST TECHNIQUE: Multidetector CT imaging of the chest was performed using the standard protocol during bolus administration of intravenous contrast. Multiplanar CT image reconstructions and MIPs were obtained to evaluate the vascular anatomy. Multidetector CT imaging of the abdomen and pelvis was performed using the standard protocol during bolus administration of intravenous contrast. CONTRAST:  100 cc Isovue 370 COMPARISON:  Chest radiograph May 29, 2017 and CT chest November 09, 2016 FINDINGS: CTA CHEST FINDINGS CARDIOVASCULAR: Adequate contrast opacification of the pulmonary artery's. Main pulmonary artery is not enlarged. No pulmonary arterial filling defects to the level of the subsegmental branches. Heart is mildly enlarged. Mild coronary artery calcification. No pericardial effusion. No pericardial effusion. Thoracic aorta is  normal course and caliber, mild calcific atherosclerosis. MEDIASTINUM/NODES: No lymphadenopathy by CT size criteria. LUNGS/PLEURA: Tracheobronchial tree is patent, no pneumothorax. Mild interlobular septal thickening/venous congestion without pleural effusion or focal consolidation. Stable 3 mm sub solid RIGHT upper lobe pulmonary nodule, no routine follow-up indicated. Mild apical bullous changes. MUSCULOSKELETAL: Advanced glenohumeral osteoarthrosis with small RIGHT bursal fluid collection. Mild canal stenosis T5-6 due to calcified ligamentum flavum. Review of the MIP images confirms the above findings. CT ABDOMEN and PELVIS FINDINGS HEPATOBILIARY: Contracted gallbladder.  Normal liver. PANCREAS: Normal. SPLEEN: Subcentimeter hypodensity in the spleen could reflect cyst  or hemangioma. ADRENALS/URINARY TRACT: Kidneys are orthotopic, demonstrating symmetric enhancement. No nephrolithiasis, hydronephrosis or solid renal masses. The unopacified ureters are normal in course and caliber. Delayed imaging through the kidneys demonstrates symmetric prompt contrast excretion within the proximal urinary collecting system. Urinary bladder is partially distended and unremarkable. Normal adrenal glands. STOMACH/BOWEL: The stomach, small and large bowel are normal in course and caliber without inflammatory changes, sensitivity decreased without oral contrast. Mild sigmoid diverticulosis. Normal appendix. VASCULAR/LYMPHATIC: Aortoiliac vessels are normal in course and caliber. Mild calcific atherosclerosis. No lymphadenopathy by CT size criteria. REPRODUCTIVE: Normal. Vaginal wall asymmetry on axial radiographs not apparent on coronal, likely due to obliquity in scanner. OTHER: No intraperitoneal free fluid or free air. MUSCULOSKELETAL: Nonacute. Severe lower lumbar facet arthropathy. Grade 1 L5-S1 anterolisthesis on degenerative basis resulting in severe L5-S1 neural foraminal narrowing. Small to moderate fat containing supraumbilical ventral hernia. IMPRESSION: CTA CHEST: 1. No acute pulmonary embolism. 2. Mild cardiomegaly and mild pulmonary edema. CT ABDOMEN AND PELVIS: 1. No acute abdominopelvic process. 2. Grade 1 L5-S1 anterolisthesis on degenerative basis with severe bilateral L5-S1 neural foraminal narrowing. Aortic Atherosclerosis (ICD10-I70.0). Electronically Signed   By: Elon Alas M.D.   On: 09/17/2017 23:25   Ct Abdomen Pelvis W Contrast  Result Date: 09/17/2017 CLINICAL DATA:  Syncopal episode, unresponsive for 1 minutes. Patient does not remember event. Assess for pulmonary embolism. History of ovarian cancer, seizures, hypertension and diabetes. EXAM: CT ANGIOGRAPHY CHEST CT ABDOMEN AND PELVIS WITH CONTRAST TECHNIQUE: Multidetector CT imaging of the chest was performed  using the standard protocol during bolus administration of intravenous contrast. Multiplanar CT image reconstructions and MIPs were obtained to evaluate the vascular anatomy. Multidetector CT imaging of the abdomen and pelvis was performed using the standard protocol during bolus administration of intravenous contrast. CONTRAST:  100 cc Isovue 370 COMPARISON:  Chest radiograph May 29, 2017 and CT chest November 09, 2016 FINDINGS: CTA CHEST FINDINGS CARDIOVASCULAR: Adequate contrast opacification of the pulmonary artery's. Main pulmonary artery is not enlarged. No pulmonary arterial filling defects to the level of the subsegmental branches. Heart is mildly enlarged. Mild coronary artery calcification. No pericardial effusion. No pericardial effusion. Thoracic aorta is normal course and caliber, mild calcific atherosclerosis. MEDIASTINUM/NODES: No lymphadenopathy by CT size criteria. LUNGS/PLEURA: Tracheobronchial tree is patent, no pneumothorax. Mild interlobular septal thickening/venous congestion without pleural effusion or focal consolidation. Stable 3 mm sub solid RIGHT upper lobe pulmonary nodule, no routine follow-up indicated. Mild apical bullous changes. MUSCULOSKELETAL: Advanced glenohumeral osteoarthrosis with small RIGHT bursal fluid collection. Mild canal stenosis T5-6 due to calcified ligamentum flavum. Review of the MIP images confirms the above findings. CT ABDOMEN and PELVIS FINDINGS HEPATOBILIARY: Contracted gallbladder.  Normal liver. PANCREAS: Normal. SPLEEN: Subcentimeter hypodensity in the spleen could reflect cyst or hemangioma. ADRENALS/URINARY TRACT: Kidneys are orthotopic, demonstrating symmetric enhancement. No nephrolithiasis, hydronephrosis or solid renal masses. The unopacified ureters are normal in course and caliber.  Delayed imaging through the kidneys demonstrates symmetric prompt contrast excretion within the proximal urinary collecting system. Urinary bladder is partially distended  and unremarkable. Normal adrenal glands. STOMACH/BOWEL: The stomach, small and large bowel are normal in course and caliber without inflammatory changes, sensitivity decreased without oral contrast. Mild sigmoid diverticulosis. Normal appendix. VASCULAR/LYMPHATIC: Aortoiliac vessels are normal in course and caliber. Mild calcific atherosclerosis. No lymphadenopathy by CT size criteria. REPRODUCTIVE: Normal. Vaginal wall asymmetry on axial radiographs not apparent on coronal, likely due to obliquity in scanner. OTHER: No intraperitoneal free fluid or free air. MUSCULOSKELETAL: Nonacute. Severe lower lumbar facet arthropathy. Grade 1 L5-S1 anterolisthesis on degenerative basis resulting in severe L5-S1 neural foraminal narrowing. Small to moderate fat containing supraumbilical ventral hernia. IMPRESSION: CTA CHEST: 1. No acute pulmonary embolism. 2. Mild cardiomegaly and mild pulmonary edema. CT ABDOMEN AND PELVIS: 1. No acute abdominopelvic process. 2. Grade 1 L5-S1 anterolisthesis on degenerative basis with severe bilateral L5-S1 neural foraminal narrowing. Aortic Atherosclerosis (ICD10-I70.0). Electronically Signed   By: Elon Alas M.D.   On: 09/17/2017 23:25   US Carotid Bilateral  Result Date: 09/18/2017 CLINICAL DATA:  56 year old female with a history of syncope. Cardiovascular risk factors include hypertension, hyperlipidemia, diabetes, tobacco EXAM: BILATERAL CAROTID DUPLEX ULTRASOUND TECHNIQUE: Pearline Cables scale imaging, color Doppler and duplex ultrasound were performed of bilateral carotid and vertebral arteries in the neck. COMPARISON:  No prior duplex FINDINGS: Criteria: Quantification of carotid stenosis is based on velocity parameters that correlate the residual internal carotid diameter with NASCET-based stenosis levels, using the diameter of the distal internal carotid lumen as the denominator for stenosis measurement. The following velocity measurements were obtained: RIGHT ICA:  Systolic 427  cm/sec, Diastolic 45 cm/sec CCA:  63 cm/sec SYSTOLIC ICA/CCA RATIO:  2.0 ECA:  73 cm/sec LEFT ICA:  Systolic 062 cm/sec, Diastolic 31 cm/sec CCA:  62 cm/sec SYSTOLIC ICA/CCA RATIO:  1.9 ECA:  66 cm/sec Right Brachial SBP: Not acquired Left Brachial SBP: Not acquired RIGHT CAROTID ARTERY: No significant calcifications of the right common carotid artery. Intermediate waveform maintained. Heterogeneous and partially calcified plaque at the right carotid bifurcation. No significant lumen shadowing. Low resistance waveform of the right ICA. No significant tortuosity. RIGHT VERTEBRAL ARTERY: Antegrade flow with low resistance waveform. LEFT CAROTID ARTERY: No significant calcifications of the left common carotid artery. Intermediate waveform maintained. Heterogeneous and partially calcified plaque at the left carotid bifurcation without significant lumen shadowing. Low resistance waveform of the left ICA. No significant tortuosity. LEFT VERTEBRAL ARTERY:  Antegrade flow with low resistance waveform. IMPRESSION: Right: Heterogeneous plaque at the carotid bifurcation, with discordant results regarding degree of stenosis by established duplex criteria. Peak velocity suggests no significant stenosis, with the ICA/ CCA ratio suggesting 50%- 69% stenosis. If establishing a more accurate degree of stenosis is required, cerebral angiogram should be considered, or as a second best test, CTA. Left: Color duplex indicates minimal heterogeneous and calcified plaque, with no hemodynamically significant stenosis by duplex criteria in the left extracranial cerebrovascular circulation. Signed, Dulcy Fanny. Earleen Newport, DO [facility] Electronically Signed   By: Corrie Mckusick D.O.   On: 09/18/2017 10:15     Assessment/Plan: 56 year old female with a history of seizures presenting after a syncopal episode and now exhibiting starring spells.  Head CT reviewed and shows no acute changes  but does show evidence of chronic infarcts.  Patient on ASA at home.  Carotid dopplers show no evidence of hemodynamically significant stenosis on the left and  50-69% stenosis on the right.  Had a CTA of the head and neck on 08/11/17 that was normal.  Would not repeat. At that time the patient had presented to the ED with right sided weakness and left gaze deviation that was felt to have a significant functional component.   Patient on Neurontin as the only anticonvulsant.    Recommendations: 1.  MRI of the brain without contrast 2.  Continue ASA 3.  Orthostatics 4.  Will f/u EEG 5.  A1c, fasting lipid panel, TSH 6.  Would continue patient on Neurontin  Alexis Goodell, MD Neurology [phone] 09/18/2017, 11:34 AM

## 2017-09-18 NOTE — Progress Notes (Signed)
   09/18/17 1800  Clinical Encounter Type  Visited With Patient  Visit Type Initial  Referral From Nurse   Patient sleeping; Asked [place] to return at a later time

## 2017-09-18 NOTE — Care Management Obs Status (Signed)
[place] NOTIFICATION   Patient Details  Name: Jennifer Weiss MRN: [id number] Date of Birth: 02/25/1961   Medicare Observation Status Notification Given:  Yes One obs notice signed, original given to patient and the copy to HIM for [place], Ceana Fiala R, RN 09/18/2017, 4:56 PM
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
## 2017-09-18 NOTE — Progress Notes (Signed)
Patient transferred from the ED via stretcher to room 253. Oriented patient to room and room equipment. Explained fall risk protocol. Bed alarm on. Verified tele box and placement of leads. Currently has no pain or discomfort when asked. Will continue to monitor patient to end of shift.

## 2017-09-18 NOTE — Progress Notes (Signed)
Nutrition Brief Note  Patient identified on the Malnutrition Screening Tool (MST) Report  56 y.o. female with a history of seizure disorder, COPD, diabetes, hypertension, hyperlipidemia, and depression who presents for evaluation of syncope versus seizure episode. According to the family patient has been very depressed. She lost her son and does not have a good relationship with her daughter.  Wt Readings from Last 15 Encounters:  09/18/17 221 lb 4.8 oz (100.4 kg)  05/30/17 216 lb 4.8 oz (98.1 kg)  02/15/17 200 lb (90.7 kg)  01/02/17 216 lb 6.4 oz (98.2 kg)  11/19/16 209 lb (94.8 kg)  11/08/16 220 lb (99.8 kg)    Body mass index is 36.83 kg/m. Patient meets criteria for obese based on current BMI. Per chart, pt with wt gain.   Current diet order is heart healthy, pt has been NPO today for diagnostics but reports that she is ready to eat. Pt reports good appetite and oral intake pta. Spoke to RN, pt can eat after MRI. RN has already taken dinner order and plans to have dinner delivered on pt's return.    Labs and medications reviewed.   No nutrition interventions warranted at this time. If nutrition issues arise, please consult RD.   Koleen Distance MS, RD, LDN Pager #- [phone] After Hours Pager: [phone]

## 2017-09-18 NOTE — H&P (Signed)
[facility] [place] NAME: Jennifer Weiss    MR#:  [id number]  DATE OF BIRTH:  10-May-1961  DATE OF ADMISSION:  09/17/2017  PRIMARY CARE PHYSICIAN: Hill, Lolo   REQUESTING/REFERRING PHYSICIAN: Alfred Levins, MD  CHIEF COMPLAINT:   Chief Complaint  Patient presents with  . Chest Pain  . Loss of Consciousness    HISTORY OF PRESENT ILLNESS:  Jennifer Weiss  is a 56 y.o. female who presents with Sharp chest pain that radiates to her back. Patient states that she's had this for the past 24-36 hours. She had an episode today where she lost consciousness, and family found her and provided some brief CPR. When EMS got there the patient had a pulse. She was brought to the ED for evaluation. Here she was noted to have some "staring" spells. Patient does have a history of seizures, but states that she is always been told that her seizures are "shaking seizures". Initial workup in the ED is largely within normal limits. Hospitalists were called for admission and further evaluation  PAST MEDICAL HISTORY:   Past Medical History:  Diagnosis Date  . Arthritis   . Asthma   . Cancer ([place])    ovarian  . Diabetes mellitus without complication ([place])   . Hypertension   . Seizures ([place])     PAST SURGICAL HISTORY:   Past Surgical History:  Procedure Laterality Date  . none      SOCIAL HISTORY:   Social History  Substance Use Topics  . Smoking status: Current Every Day Smoker    Packs/day: 0.50    Types: Cigarettes  . Smokeless tobacco: Never Used  . Alcohol use No    FAMILY HISTORY:   Family History  Problem Relation Age of Onset  . Diabetes Mellitus II Mother     DRUG ALLERGIES:   Allergies  Allergen Reactions  . Nystatin Other (See Comments)    Reaction: unknown    MEDICATIONS AT HOME:   Prior to Admission medications   Medication Sig Start Date End Date Taking? Authorizing Provider  acyclovir (ZOVIRAX) 800  MG tablet Take 800 mg by mouth daily.   Yes [provider]  albuterol (PROVENTIL HFA;VENTOLIN HFA) 108 (90 Base) MCG/ACT inhaler Inhale 2 puffs into the lungs every 4 (four) hours as needed for wheezing or shortness of breath.   Yes [provider]  ASPIR-LOW 81 MG EC tablet Take 81 mg by mouth daily.   Yes [provider]  atenolol-chlorthalidone (TENORETIC) 100-25 MG tablet Take 1 tablet by mouth daily.   Yes [provider]  atorvastatin (LIPITOR) 40 MG tablet Take 40 mg by mouth daily.   Yes [provider]  budesonide-formoterol (SYMBICORT) 160-4.5 MCG/ACT inhaler Inhale 2 puffs into the lungs 2 (two) times daily.   Yes [provider]  FLUoxetine (PROZAC) 10 MG capsule Take 10 mg by mouth daily.   Yes [provider]  fluticasone (FLOVENT HFA) 110 MCG/ACT inhaler Inhale 2 puffs into the lungs 2 (two) times daily.   Yes [provider]  gabapentin (NEURONTIN) 300 MG capsule Take 900 mg by mouth 3 (three) times daily.   Yes [provider]  ipratropium (ATROVENT HFA) 17 MCG/ACT inhaler Inhale 2 puffs into the lungs 2 (two) times daily.   Yes [provider]  ipratropium-albuterol (DUONEB) 0.5-2.5 (3) MG/3ML SOLN Take 3 mLs by nebulization every 4 (four) hours as needed (Wheezing or shortness of breath).  05/30/17  Yes Sudini, Alveta Heimlich, MD  metFORMIN (GLUCOPHAGE) 500 MG tablet Take 500 mg by mouth 2 (two) times daily with a meal.   Yes [provider]  guaiFENesin-dextromethorphan (ROBITUSSIN DM) 100-10 MG/5ML syrup Take 5 mLs by mouth every 4 (four) hours as needed for cough. Patient not taking: Reported on 09/17/2017 05/30/17   Hillary Bow, MD    REVIEW OF SYSTEMS:  Review of Systems  Constitutional: Negative for chills, fever, malaise/fatigue and weight loss.  HENT: Negative for ear pain, hearing loss and tinnitus.   Eyes: Negative for blurred vision, double vision, pain and redness.   Respiratory: Negative for cough, hemoptysis and shortness of breath.   Cardiovascular: Positive for chest pain. Negative for palpitations, orthopnea and leg swelling.  Gastrointestinal: Negative for abdominal pain, constipation, diarrhea, nausea and vomiting.  Genitourinary: Negative for dysuria, frequency and hematuria.  Musculoskeletal: Negative for back pain, joint pain and neck pain.  Skin:       No acne, rash, or lesions  Neurological: Positive for loss of consciousness. Negative for dizziness, tremors, focal weakness and weakness.  Endo/Heme/Allergies: Negative for polydipsia. Does not bruise/bleed easily.  Psychiatric/Behavioral: Negative for depression. The patient is not nervous/anxious and does not have insomnia.      VITAL SIGNS:   Vitals:   09/17/17 2103 09/17/17 2200 09/17/17 2330 09/18/17 0000  BP:  (!) 156/76 (!) 173/79 (!) 158/88  Pulse:  (!) 59 (!) 58 (!) 54  Resp:  (!) 23 19 (!) 21  Temp:      TempSrc:      SpO2: 95% 97% 96% 94%  Weight:      Height:       Wt Readings from Last 3 Encounters:  09/17/17 96.6 kg (213 lb)  05/30/17 98.1 kg (216 lb 4.8 oz)  02/15/17 90.7 kg (200 lb)    PHYSICAL EXAMINATION:  Physical Exam  Vitals reviewed. Constitutional: She is oriented to person, place, and time. She appears well-developed and well-nourished. No distress.  HENT:  Head: Normocephalic and atraumatic.  Mouth/Throat: Oropharynx is clear and moist.  Eyes: Pupils are equal, round, and reactive to light. Conjunctivae and EOM are normal. No scleral icterus.  Neck: Normal range of motion. Neck supple. No JVD present. No thyromegaly present.  Cardiovascular: Normal rate, regular rhythm and intact distal pulses.  Exam reveals no gallop and no friction rub.   No murmur heard. Respiratory: Effort normal and breath sounds normal. No respiratory distress. She has no wheezes. She has no rales.  GI: Soft. Bowel sounds are normal. She exhibits no distension. There is no  tenderness.  Musculoskeletal: Normal range of motion. She exhibits no edema.  No arthritis, no gout  Lymphadenopathy:    She has no cervical adenopathy.  Neurological: She is alert and oriented to person, place, and time. No cranial nerve deficit.  No dysarthria, no aphasia  Skin: Skin is warm and dry. No rash noted. No erythema.  Psychiatric: She has a normal mood and affect. Her behavior is normal. Judgment and thought content normal.    LABORATORY PANEL:   CBC  Recent Labs Lab 09/17/17 2102  WBC 8.7  HGB 12.4  HCT 35.8  PLT 176   ------------------------------------------------------------------------------------------------------------------  Chemistries   Recent Labs Lab 09/17/17 2102  NA 145  K 3.1*  CL 107  CO2 28  GLUCOSE 113*  BUN 15  CREATININE 0.91  CALCIUM 9.0  AST 22  ALT 17  ALKPHOS 109  BILITOT 0.4   ------------------------------------------------------------------------------------------------------------------  Cardiac Enzymes  Recent Labs Lab 09/17/17 2102  TROPONINI <0.03   ------------------------------------------------------------------------------------------------------------------  RADIOLOGY:  Ct Head Wo Contrast  Result Date: 09/17/2017 CLINICAL DATA:  Centralized chest pain, shortness of breath. Family performed CPR, patient does not recall. History of hypertension and diabetes, ovarian cancer and seizures. EXAM: CT HEAD WITHOUT CONTRAST TECHNIQUE: Contiguous axial images were obtained from the base of the skull through the vertex without intravenous contrast. COMPARISON:  CT HEAD February 15, 2017 FINDINGS: BRAIN: No intraparenchymal hemorrhage, mass effect nor midline shift. Multifocal LEFT frontal encephalomalacia. Small air RIGHT occipital lobe encephalomalacia. Old small bilateral cerebellar infarcts. The ventricles and sulci are normal. Patchy supratentorial [place] matter hypodensities. No acute large vascular territory infarcts.  No abnormal extra-axial fluid collections. Basal cisterns are patent. VASCULAR: Mild calcific atherosclerosis carotid siphons. SKULL/SOFT TISSUES: No skull fracture. No significant soft tissue swelling. ORBITS/SINUSES: The included ocular globes and orbital contents are normal.The mastoid aircells and included paranasal sinuses are well-aerated. OTHER: None. IMPRESSION: 1. No acute intracranial process. 2. 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 Small cerebellar infarcts. 3. Mild to moderate chronic small vessel ischemic disease. 4. LEFT inferior frontal lobe encephalomalacia is likely posttraumatic. Electronically Signed   By: Elon Alas M.D.   On: 09/17/2017 22:15   Ct Angio Chest Pe W And/or Wo Contrast  Result Date: 09/17/2017 CLINICAL DATA:  Syncopal episode, unresponsive for 1 minutes. Patient does not remember event. Assess for pulmonary embolism. History of ovarian cancer, seizures, hypertension and diabetes. EXAM: CT ANGIOGRAPHY CHEST CT ABDOMEN AND PELVIS WITH CONTRAST TECHNIQUE: Multidetector CT imaging of the chest was performed using the standard protocol during bolus administration of intravenous contrast. Multiplanar CT image reconstructions and MIPs were obtained to evaluate the vascular anatomy. Multidetector CT imaging of the abdomen and pelvis was performed using the standard protocol during bolus administration of intravenous contrast. CONTRAST:  100 cc Isovue 370 COMPARISON:  Chest radiograph May 29, 2017 and CT chest November 09, 2016 FINDINGS: CTA CHEST FINDINGS CARDIOVASCULAR: Adequate contrast opacification of the pulmonary artery's. Main pulmonary artery is not enlarged. No pulmonary arterial filling defects to the level of the subsegmental branches. Heart is mildly enlarged. Mild coronary artery calcification. No pericardial effusion. No pericardial effusion. Thoracic aorta is normal course and caliber, mild calcific atherosclerosis. MEDIASTINUM/NODES: No lymphadenopathy  by CT size criteria. LUNGS/PLEURA: Tracheobronchial tree is patent, no pneumothorax. Mild interlobular septal thickening/venous congestion without pleural effusion or focal consolidation. Stable 3 mm sub solid RIGHT upper lobe pulmonary nodule, no routine follow-up indicated. Mild apical bullous changes. MUSCULOSKELETAL: Advanced glenohumeral osteoarthrosis with small RIGHT bursal fluid collection. Mild canal stenosis T5-6 due to calcified ligamentum flavum. Review of the MIP images confirms the above findings. CT ABDOMEN and PELVIS FINDINGS HEPATOBILIARY: Contracted gallbladder.  Normal liver. PANCREAS: Normal. SPLEEN: Subcentimeter hypodensity in the spleen could reflect cyst or hemangioma. ADRENALS/URINARY TRACT: Kidneys are orthotopic, demonstrating symmetric enhancement. No nephrolithiasis, hydronephrosis or solid renal masses. The unopacified ureters are normal in course and caliber. Delayed imaging through the kidneys demonstrates symmetric prompt contrast excretion within the proximal urinary collecting system. Urinary bladder is partially distended and unremarkable. Normal adrenal glands. STOMACH/BOWEL: The stomach, small and large bowel are normal in course and caliber without inflammatory changes, sensitivity decreased without oral contrast. Mild sigmoid diverticulosis. Normal appendix. VASCULAR/LYMPHATIC: Aortoiliac vessels are normal in course and caliber. Mild calcific atherosclerosis. No lymphadenopathy by CT size criteria. REPRODUCTIVE: Normal. Vaginal wall asymmetry on axial radiographs not apparent on coronal, likely due to obliquity in  scanner. OTHER: No intraperitoneal free fluid or free air. MUSCULOSKELETAL: Nonacute. Severe lower lumbar facet arthropathy. Grade 1 L5-S1 anterolisthesis on degenerative basis resulting in severe L5-S1 neural foraminal narrowing. Small to moderate fat containing supraumbilical ventral hernia. IMPRESSION: CTA CHEST: 1. No acute pulmonary embolism. 2. Mild  cardiomegaly and mild pulmonary edema. CT ABDOMEN AND PELVIS: 1. No acute abdominopelvic process. 2. Grade 1 L5-S1 anterolisthesis on degenerative basis with severe bilateral L5-S1 neural foraminal narrowing. Aortic Atherosclerosis (ICD10-I70.0). Electronically Signed   By: Elon Alas M.D.   On: 09/17/2017 23:25   Ct Abdomen Pelvis W Contrast  Result Date: 09/17/2017 CLINICAL DATA:  Syncopal episode, unresponsive for 1 minutes. Patient does not remember event. Assess for pulmonary embolism. History of ovarian cancer, seizures, hypertension and diabetes. EXAM: CT ANGIOGRAPHY CHEST CT ABDOMEN AND PELVIS WITH CONTRAST TECHNIQUE: Multidetector CT imaging of the chest was performed using the standard protocol during bolus administration of intravenous contrast. Multiplanar CT image reconstructions and MIPs were obtained to evaluate the vascular anatomy. Multidetector CT imaging of the abdomen and pelvis was performed using the standard protocol during bolus administration of intravenous contrast. CONTRAST:  100 cc Isovue 370 COMPARISON:  Chest radiograph May 29, 2017 and CT chest November 09, 2016 FINDINGS: CTA CHEST FINDINGS CARDIOVASCULAR: Adequate contrast opacification of the pulmonary artery's. Main pulmonary artery is not enlarged. No pulmonary arterial filling defects to the level of the subsegmental branches. Heart is mildly enlarged. Mild coronary artery calcification. No pericardial effusion. No pericardial effusion. Thoracic aorta is normal course and caliber, mild calcific atherosclerosis. MEDIASTINUM/NODES: No lymphadenopathy by CT size criteria. LUNGS/PLEURA: Tracheobronchial tree is patent, no pneumothorax. Mild interlobular septal thickening/venous congestion without pleural effusion or focal consolidation. Stable 3 mm sub solid RIGHT upper lobe pulmonary nodule, no routine follow-up indicated. Mild apical bullous changes. MUSCULOSKELETAL: Advanced glenohumeral osteoarthrosis with small RIGHT  bursal fluid collection. Mild canal stenosis T5-6 due to calcified ligamentum flavum. Review of the MIP images confirms the above findings. CT ABDOMEN and PELVIS FINDINGS HEPATOBILIARY: Contracted gallbladder.  Normal liver. PANCREAS: Normal. SPLEEN: Subcentimeter hypodensity in the spleen could reflect cyst or hemangioma. ADRENALS/URINARY TRACT: Kidneys are orthotopic, demonstrating symmetric enhancement. No nephrolithiasis, hydronephrosis or solid renal masses. The unopacified ureters are normal in course and caliber. Delayed imaging through the kidneys demonstrates symmetric prompt contrast excretion within the proximal urinary collecting system. Urinary bladder is partially distended and unremarkable. Normal adrenal glands. STOMACH/BOWEL: The stomach, small and large bowel are normal in course and caliber without inflammatory changes, sensitivity decreased without oral contrast. Mild sigmoid diverticulosis. Normal appendix. VASCULAR/LYMPHATIC: Aortoiliac vessels are normal in course and caliber. Mild calcific atherosclerosis. No lymphadenopathy by CT size criteria. REPRODUCTIVE: Normal. Vaginal wall asymmetry on axial radiographs not apparent on coronal, likely due to obliquity in scanner. OTHER: No intraperitoneal free fluid or free air. MUSCULOSKELETAL: Nonacute. Severe lower lumbar facet arthropathy. Grade 1 L5-S1 anterolisthesis on degenerative basis resulting in severe L5-S1 neural foraminal narrowing. Small to moderate fat containing supraumbilical ventral hernia. IMPRESSION: CTA CHEST: 1. No acute pulmonary embolism. 2. Mild cardiomegaly and mild pulmonary edema. CT ABDOMEN AND PELVIS: 1. No acute abdominopelvic process. 2. Grade 1 L5-S1 anterolisthesis on degenerative basis with severe bilateral L5-S1 neural foraminal narrowing. Aortic Atherosclerosis (ICD10-I70.0). Electronically Signed   By: Elon Alas M.D.   On: 09/17/2017 23:25    EKG:   Orders placed or performed during the hospital  encounter of 09/17/17  . EKG 12-Lead  . EKG 12-Lead  . ED EKG  . ED  EKG    IMPRESSION AND PLAN:  Principal Problem:   Chest pain - patient does not currently have any chest pain. Her EKG showed prolonged QT interval. We will cycle her cardiac enzymes, get an echocardiogram and cardiology consult Active Problems:   Loss of consciousness ([place]) - unclear if this may have been cardiac related syncope, versus some sort of seizure. Treatment as above and below   Seizures ([place]) - patient is not currently on antiepileptics, we will get a neurology consult to help evaluate whether her loss of consciousness and these staring spells may have been a typical seizures   Diabetes ([place]) - sliding scale insulin with corresponding glucose checks   HTN (hypertension) - continue home meds  All the records are reviewed and case discussed with ED provider. Management plans discussed with the patient and/or family.  DVT PROPHYLAXIS: SubQ lovenox  GI PROPHYLAXIS: None  ADMISSION STATUS: Observation  CODE STATUS: Full Code Status History    Date Active Date Inactive Code Status Order ID Comments User Context   05/29/2017  3:45 PM 05/30/2017  5:09 PM Full Code [id number]  Hillary Bow, MD ED   12/31/2016  7:44 AM 01/02/2017  3:19 PM Full Code [id number]  Harrie Foreman, MD Inpatient      TOTAL TIME TAKING CARE OF THIS PATIENT: 40 minutes.   Tigran Haynie [place] 09/18/2017, 12:52 AM  [facility]  Office  [phone]  CC: Primary care physician; [place]  Note:  This document was prepared using Dragon voice recognition software and may include unintentional dictation errors.

## 2017-09-18 NOTE — Clinical Social Work Note (Signed)
CSW received consult that patient may be homeless, patient informed care team that she is living with her niece and has equipment there.  Patient informed case manager that she does not have any discharge needs currently.  CSW to sign off, please reconsult if other social work needs arise.  Jones Broom. [place], MSW, [place]  09/18/2017 7:07 PM

## 2017-09-19 DIAGNOSIS — Z8673 Personal history of transient ischemic attack (TIA), and cerebral infarction without residual deficits: Secondary | ICD-10-CM | POA: Diagnosis not present

## 2017-09-19 DIAGNOSIS — R4020 Unspecified coma: Secondary | ICD-10-CM | POA: Diagnosis not present

## 2017-09-19 DIAGNOSIS — F331 Major depressive disorder, recurrent, moderate: Secondary | ICD-10-CM | POA: Diagnosis not present

## 2017-09-19 DIAGNOSIS — R55 Syncope and collapse: Secondary | ICD-10-CM | POA: Diagnosis not present

## 2017-09-19 DIAGNOSIS — R079 Chest pain, unspecified: Secondary | ICD-10-CM | POA: Diagnosis not present

## 2017-09-19 LAB — GLUCOSE, CAPILLARY
GLUCOSE-CAPILLARY: 111 mg/dL — AB (ref 65–99)
GLUCOSE-CAPILLARY: 111 mg/dL — AB (ref 65–99)
GLUCOSE-CAPILLARY: 155 mg/dL — AB (ref 65–99)
Glucose-Capillary: 143 mg/dL — ABNORMAL HIGH (ref 65–99)

## 2017-09-19 LAB — MAGNESIUM: MAGNESIUM: 2.3 mg/dL (ref 1.7–2.4)

## 2017-09-19 LAB — BASIC METABOLIC PANEL
ANION GAP: 8 (ref 5–15)
BUN: 14 mg/dL (ref 6–20)
CALCIUM: 8.8 mg/dL — AB (ref 8.9–10.3)
CO2: 28 mmol/L (ref 22–32)
Chloride: 104 mmol/L (ref 101–111)
Creatinine, Ser: 0.83 mg/dL (ref 0.44–1.00)
Glucose, Bld: 131 mg/dL — ABNORMAL HIGH (ref 65–99)
Potassium: 3.6 mmol/L (ref 3.5–5.1)
Sodium: 140 mmol/L (ref 135–145)

## 2017-09-19 LAB — ECHOCARDIOGRAM COMPLETE
Height: 65 in
Weight: 3540.8 oz

## 2017-09-19 MED ORDER — SERTRALINE HCL 50 MG PO TABS
25.0000 mg | ORAL_TABLET | Freq: Every day | ORAL | Status: DC
  Administered 2017-09-19 – 2017-09-20 (×2): 25 mg via ORAL
  Filled 2017-09-19 (×2): qty 1

## 2017-09-19 MED ORDER — SERTRALINE HCL 25 MG PO TABS: 25.0000 mg | ORAL_TABLET | Freq: Every day | ORAL | 2 refills | Status: DC

## 2017-09-19 MED ORDER — GABAPENTIN 600 MG PO TABS
900.0000 mg | ORAL_TABLET | Freq: Three times a day (TID) | ORAL | Status: DC
  Administered 2017-09-19 – 2017-09-20 (×4): 900 mg via ORAL
  Filled 2017-09-19 (×4): qty 2

## 2017-09-19 MED ORDER — AMLODIPINE BESYLATE 5 MG PO TABS: 5.0000 mg | ORAL_TABLET | Freq: Every day | ORAL | 2 refills | Status: DC

## 2017-09-19 MED ORDER — POTASSIUM CHLORIDE CRYS ER 20 MEQ PO TBCR
40.0000 meq | EXTENDED_RELEASE_TABLET | Freq: Once | ORAL | Status: AC
  Administered 2017-09-19: 40 meq via ORAL
  Filled 2017-09-19: qty 2

## 2017-09-19 MED ORDER — TRAZODONE HCL 50 MG PO TABS
50.0000 mg | ORAL_TABLET | Freq: Every evening | ORAL | Status: DC | PRN
  Administered 2017-09-19: 50 mg via ORAL
  Filled 2017-09-19: qty 1

## 2017-09-19 MED ORDER — SODIUM CHLORIDE 0.9 % IV SOLN
INTRAVENOUS | Status: DC
  Administered 2017-09-19: 16:00:00 via INTRAVENOUS

## 2017-09-19 MED ORDER — AMLODIPINE BESYLATE 5 MG PO TABS
5.0000 mg | ORAL_TABLET | Freq: Every day | ORAL | Status: DC
  Administered 2017-09-19 – 2017-09-20 (×2): 5 mg via ORAL
  Filled 2017-09-19 (×2): qty 1

## 2017-09-19 MED ORDER — TRAZODONE HCL 50 MG PO TABS: 50.0000 mg | ORAL_TABLET | Freq: Every evening | ORAL | 2 refills | Status: DC | PRN

## 2017-09-19 NOTE — Progress Notes (Signed)
Subjective: Patient without further events.  No new neurological complaints.    Objective: Current vital signs: BP (!) 143/72   Pulse (!) 58   Temp 97.9 F (36.6 C) (Oral)   Resp 18   Ht 5\' 5"  (1.651 m)   Wt 100.4 kg (221 lb 4.8 oz)   SpO2 98%   BMI 36.83 kg/m  Vital signs in last 24 hours: Temp:  [97.9 F (36.6 C)-99 F (37.2 C)] 97.9 F (36.6 C) (10/06 0459) Pulse Rate:  [51-92] 58 (10/06 1211) Resp:  [18] 18 (10/06 1211) BP: (120-152)/(59-79) 143/72 (10/06 1211) SpO2:  [94 %-99 %] 98 % (10/06 1211)  Intake/Output from previous day: 10/05 0701 - 10/06 0700 In: 240 [P.O.:240] Out: 1000 [Urine:1000] Intake/Output this shift: Total I/O In: 240 [P.O.:240] Out: -  Nutritional status: Diet Heart Room service appropriate? Yes; Fluid consistency: Thin Diet - low sodium heart healthy  Neurologic Exam: Mental Status: Alert, oriented, thought content appropriate.  Speech fluent without evidence of aphasia.  Able to follow 3 step commands without difficulty. Cranial Nerves: II: Discs flat bilaterally; Visual fields grossly normal, pupils equal, round, reactive to light and accommodation III,IV, VI: ptosis not present, extra-ocular motions intact bilaterally V,VII: smile symmetric, facial light touch sensation normal bilaterally VIII: hearing normal bilaterally IX,X: gag reflex present XI: bilateral shoulder shrug XII: midline tongue extension Motor: Right :  Upper extremity   5/5                                      Left:     Upper extremity   5/5             Lower extremity   5/5                                                  Lower extremity   5/5 Tone and bulk:normal tone throughout; no atrophy noted Sensory: Pinprick and light touch intact throughout, bilaterally   Lab Results: Basic Metabolic Panel:  Recent Labs Lab 09/17/17 2102 09/18/17 0811 09/19/17 1111  NA 145 143 140  K 3.1* 3.6 3.6  CL 107 106 104  CO2 28 28 28   GLUCOSE 113* 112* 131*  BUN 15 13  14   CREATININE 0.91 0.85 0.83  CALCIUM 9.0 8.8* 8.8*  MG  --   --  2.3    Liver Function Tests:  Recent Labs Lab 09/17/17 2102  AST 22  ALT 17  ALKPHOS 109  BILITOT 0.4  PROT 6.5  ALBUMIN 3.4*    Recent Labs Lab 09/17/17 2102  LIPASE 24   No results for input(s): AMMONIA in the last 168 hours.  CBC:  Recent Labs Lab 09/17/17 2102 09/18/17 0811  WBC 8.7 5.5  HGB 12.4 12.4  HCT 35.8 37.2  MCV 89.0 91.5  PLT 176 150    Cardiac Enzymes:  Recent Labs Lab 09/17/17 2102 09/18/17 0201 09/18/17 0811  TROPONINI <0.03 <0.03 <0.03    Lipid Panel:  Recent Labs Lab 09/18/17 1244  CHOL 143  TRIG 87  HDL 34*  CHOLHDL 4.2  VLDL 17  LDLCALC 92    CBG:  Recent Labs Lab 09/18/17 1253 09/18/17 1602 09/18/17 2105 09/19/17 0806 09/19/17 1141  GLUCAP 91 87 137* 111* 111*  Microbiology: No results found for this or any previous visit.  Coagulation Studies: No results for input(s): LABPROT, INR in the last 72 hours.  Imaging: Ct Head Wo Contrast  Result Date: 09/17/2017 CLINICAL DATA:  Centralized chest pain, shortness of breath. Family performed CPR, patient does not recall. History of hypertension and diabetes, ovarian cancer and seizures. EXAM: CT HEAD WITHOUT CONTRAST TECHNIQUE: Contiguous axial images were obtained from the base of the skull through the vertex without intravenous contrast. COMPARISON:  CT HEAD February 15, 2017 FINDINGS: BRAIN: No intraparenchymal hemorrhage, mass effect nor midline shift. Multifocal LEFT frontal encephalomalacia. Small air RIGHT occipital lobe encephalomalacia. Old small bilateral cerebellar infarcts. The ventricles and sulci are normal. Patchy supratentorial Kim matter hypodensities. No acute large vascular territory infarcts. No abnormal extra-axial fluid collections. Basal cisterns are patent. VASCULAR: Mild calcific atherosclerosis carotid siphons. SKULL/SOFT TISSUES: No skull fracture. No significant soft tissue  swelling. ORBITS/SINUSES: The included ocular globes and orbital contents are normal.The mastoid aircells and included paranasal sinuses are well-aerated. OTHER: None. IMPRESSION: 1. No acute intracranial process. 2. 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 Small cerebellar infarcts. 3. Mild to moderate chronic small vessel ischemic disease. 4. LEFT inferior frontal lobe encephalomalacia is likely posttraumatic. Electronically Signed   By: Elon Alas M.D.   On: 09/17/2017 22:15   Ct Angio Chest Pe W And/or Wo Contrast  Result Date: 09/17/2017 CLINICAL DATA:  Syncopal episode, unresponsive for 1 minutes. Patient does not remember event. Assess for pulmonary embolism. History of ovarian cancer, seizures, hypertension and diabetes. EXAM: CT ANGIOGRAPHY CHEST CT ABDOMEN AND PELVIS WITH CONTRAST TECHNIQUE: Multidetector CT imaging of the chest was performed using the standard protocol during bolus administration of intravenous contrast. Multiplanar CT image reconstructions and MIPs were obtained to evaluate the vascular anatomy. Multidetector CT imaging of the abdomen and pelvis was performed using the standard protocol during bolus administration of intravenous contrast. CONTRAST:  100 cc Isovue 370 COMPARISON:  Chest radiograph May 29, 2017 and CT chest November 09, 2016 FINDINGS: CTA CHEST FINDINGS CARDIOVASCULAR: Adequate contrast opacification of the pulmonary artery's. Main pulmonary artery is not enlarged. No pulmonary arterial filling defects to the level of the subsegmental branches. Heart is mildly enlarged. Mild coronary artery calcification. No pericardial effusion. No pericardial effusion. Thoracic aorta is normal course and caliber, mild calcific atherosclerosis. MEDIASTINUM/NODES: No lymphadenopathy by CT size criteria. LUNGS/PLEURA: Tracheobronchial tree is patent, no pneumothorax. Mild interlobular septal thickening/venous congestion without pleural effusion or focal consolidation.  Stable 3 mm sub solid RIGHT upper lobe pulmonary nodule, no routine follow-up indicated. Mild apical bullous changes. MUSCULOSKELETAL: Advanced glenohumeral osteoarthrosis with small RIGHT bursal fluid collection. Mild canal stenosis T5-6 due to calcified ligamentum flavum. Review of the MIP images confirms the above findings. CT ABDOMEN and PELVIS FINDINGS HEPATOBILIARY: Contracted gallbladder.  Normal liver. PANCREAS: Normal. SPLEEN: Subcentimeter hypodensity in the spleen could reflect cyst or hemangioma. ADRENALS/URINARY TRACT: Kidneys are orthotopic, demonstrating symmetric enhancement. No nephrolithiasis, hydronephrosis or solid renal masses. The unopacified ureters are normal in course and caliber. Delayed imaging through the kidneys demonstrates symmetric prompt contrast excretion within the proximal urinary collecting system. Urinary bladder is partially distended and unremarkable. Normal adrenal glands. STOMACH/BOWEL: The stomach, small and large bowel are normal in course and caliber without inflammatory changes, sensitivity decreased without oral contrast. Mild sigmoid diverticulosis. Normal appendix. VASCULAR/LYMPHATIC: Aortoiliac vessels are normal in course and caliber. Mild calcific atherosclerosis. No lymphadenopathy by CT size criteria. REPRODUCTIVE: Normal. Vaginal wall asymmetry on axial radiographs  not apparent on coronal, likely due to obliquity in scanner. OTHER: No intraperitoneal free fluid or free air. MUSCULOSKELETAL: Nonacute. Severe lower lumbar facet arthropathy. Grade 1 L5-S1 anterolisthesis on degenerative basis resulting in severe L5-S1 neural foraminal narrowing. Small to moderate fat containing supraumbilical ventral hernia. IMPRESSION: CTA CHEST: 1. No acute pulmonary embolism. 2. Mild cardiomegaly and mild pulmonary edema. CT ABDOMEN AND PELVIS: 1. No acute abdominopelvic process. 2. Grade 1 L5-S1 anterolisthesis on degenerative basis with severe bilateral L5-S1 neural foraminal  narrowing. Aortic Atherosclerosis (ICD10-I70.0). Electronically Signed   By: Elon Alas M.D.   On: 09/17/2017 23:25   Mr Brain Wo Contrast  Result Date: 09/18/2017 CLINICAL DATA:  Recurrent syncope. Staring spells. History of seizures on unknown medication. EXAM: MRI HEAD WITHOUT CONTRAST TECHNIQUE: Multiplanar, multiecho pulse sequences of the brain and surrounding structures were obtained without intravenous contrast. COMPARISON:  Head CT from yesterday FINDINGS: Brain: Small to moderate cortically based remote infarcts along the left frontal convexity and in the inferior right occipital temporal cortex. Small remote bilateral cerebellar infarcts. Overall normal brain volume. No acute infarct, hemorrhage, hydrocephalus, or masslike finding. Vascular: Major vessels are patent Skull and upper cervical spine: Negative for marrow lesion Sinuses/Orbits: Cervical facet spurring on the left. IMPRESSION: 1. No acute finding. 2. Cluster of small to moderate remote infarcts along the left frontal convexity. Small remote right temporal occipital infarct. These infarcts cause multifocal cortical gliosis which could predispose to seizures. 3. Small remote bilateral cerebellar infarcts. Electronically Signed   By: Monte Fantasia M.D.   On: 09/18/2017 15:37   Ct Abdomen Pelvis W Contrast  Result Date: 09/17/2017 CLINICAL DATA:  Syncopal episode, unresponsive for 1 minutes. Patient does not remember event. Assess for pulmonary embolism. History of ovarian cancer, seizures, hypertension and diabetes. EXAM: CT ANGIOGRAPHY CHEST CT ABDOMEN AND PELVIS WITH CONTRAST TECHNIQUE: Multidetector CT imaging of the chest was performed using the standard protocol during bolus administration of intravenous contrast. Multiplanar CT image reconstructions and MIPs were obtained to evaluate the vascular anatomy. Multidetector CT imaging of the abdomen and pelvis was performed using the standard protocol during bolus administration  of intravenous contrast. CONTRAST:  100 cc Isovue 370 COMPARISON:  Chest radiograph May 29, 2017 and CT chest November 09, 2016 FINDINGS: CTA CHEST FINDINGS CARDIOVASCULAR: Adequate contrast opacification of the pulmonary artery's. Main pulmonary artery is not enlarged. No pulmonary arterial filling defects to the level of the subsegmental branches. Heart is mildly enlarged. Mild coronary artery calcification. No pericardial effusion. No pericardial effusion. Thoracic aorta is normal course and caliber, mild calcific atherosclerosis. MEDIASTINUM/NODES: No lymphadenopathy by CT size criteria. LUNGS/PLEURA: Tracheobronchial tree is patent, no pneumothorax. Mild interlobular septal thickening/venous congestion without pleural effusion or focal consolidation. Stable 3 mm sub solid RIGHT upper lobe pulmonary nodule, no routine follow-up indicated. Mild apical bullous changes. MUSCULOSKELETAL: Advanced glenohumeral osteoarthrosis with small RIGHT bursal fluid collection. Mild canal stenosis T5-6 due to calcified ligamentum flavum. Review of the MIP images confirms the above findings. CT ABDOMEN and PELVIS FINDINGS HEPATOBILIARY: Contracted gallbladder.  Normal liver. PANCREAS: Normal. SPLEEN: Subcentimeter hypodensity in the spleen could reflect cyst or hemangioma. ADRENALS/URINARY TRACT: Kidneys are orthotopic, demonstrating symmetric enhancement. No nephrolithiasis, hydronephrosis or solid renal masses. The unopacified ureters are normal in course and caliber. Delayed imaging through the kidneys demonstrates symmetric prompt contrast excretion within the proximal urinary collecting system. Urinary bladder is partially distended and unremarkable. Normal adrenal glands. STOMACH/BOWEL: The stomach, small and large bowel are normal in course and caliber  without inflammatory changes, sensitivity decreased without oral contrast. Mild sigmoid diverticulosis. Normal appendix. VASCULAR/LYMPHATIC: Aortoiliac vessels are normal  in course and caliber. Mild calcific atherosclerosis. No lymphadenopathy by CT size criteria. REPRODUCTIVE: Normal. Vaginal wall asymmetry on axial radiographs not apparent on coronal, likely due to obliquity in scanner. OTHER: No intraperitoneal free fluid or free air. MUSCULOSKELETAL: Nonacute. Severe lower lumbar facet arthropathy. Grade 1 L5-S1 anterolisthesis on degenerative basis resulting in severe L5-S1 neural foraminal narrowing. Small to moderate fat containing supraumbilical ventral hernia. IMPRESSION: CTA CHEST: 1. No acute pulmonary embolism. 2. Mild cardiomegaly and mild pulmonary edema. CT ABDOMEN AND PELVIS: 1. No acute abdominopelvic process. 2. Grade 1 L5-S1 anterolisthesis on degenerative basis with severe bilateral L5-S1 neural foraminal narrowing. Aortic Atherosclerosis (ICD10-I70.0). Electronically Signed   By: Elon Alas M.D.   On: 09/17/2017 23:25   US Carotid Bilateral  Result Date: 09/18/2017 CLINICAL DATA:  56 year old female with a history of syncope. Cardiovascular risk factors include hypertension, hyperlipidemia, diabetes, tobacco EXAM: BILATERAL CAROTID DUPLEX ULTRASOUND TECHNIQUE: Pearline Cables scale imaging, color Doppler and duplex ultrasound were performed of bilateral carotid and vertebral arteries in the neck. COMPARISON:  No prior duplex FINDINGS: Criteria: Quantification of carotid stenosis is based on velocity parameters that correlate the residual internal carotid diameter with NASCET-based stenosis levels, using the diameter of the distal internal carotid lumen as the denominator for stenosis measurement. The following velocity measurements were obtained: RIGHT ICA:  Systolic 696 cm/sec, Diastolic 45 cm/sec CCA:  63 cm/sec SYSTOLIC ICA/CCA RATIO:  2.0 ECA:  73 cm/sec LEFT ICA:  Systolic 295 cm/sec, Diastolic 31 cm/sec CCA:  62 cm/sec SYSTOLIC ICA/CCA RATIO:  1.9 ECA:  66 cm/sec Right Brachial SBP: Not acquired Left Brachial SBP: Not acquired RIGHT CAROTID ARTERY: No  significant calcifications of the right common carotid artery. Intermediate waveform maintained. Heterogeneous and partially calcified plaque at the right carotid bifurcation. No significant lumen shadowing. Low resistance waveform of the right ICA. No significant tortuosity. RIGHT VERTEBRAL ARTERY: Antegrade flow with low resistance waveform. LEFT CAROTID ARTERY: No significant calcifications of the left common carotid artery. Intermediate waveform maintained. Heterogeneous and partially calcified plaque at the left carotid bifurcation without significant lumen shadowing. Low resistance waveform of the left ICA. No significant tortuosity. LEFT VERTEBRAL ARTERY:  Antegrade flow with low resistance waveform. IMPRESSION: Right: Heterogeneous plaque at the carotid bifurcation, with discordant results regarding degree of stenosis by established duplex criteria. Peak velocity suggests no significant stenosis, with the ICA/ CCA ratio suggesting 50%- 69% stenosis. If establishing a more accurate degree of stenosis is required, cerebral angiogram should be considered, or as a second best test, CTA. Left: Color duplex indicates minimal heterogeneous and calcified plaque, with no hemodynamically significant stenosis by duplex criteria in the left extracranial cerebrovascular circulation. Signed, Dulcy Fanny. Earleen Newport, DO [facility] Electronically Signed   By: Corrie Mckusick D.O.   On: 09/18/2017 10:15    Medications:  I have reviewed the patient's current medications. Scheduled: . acyclovir  800 mg Oral Daily  . amLODipine  5 mg Oral Daily  . aspirin EC  81 mg Oral Daily  . atorvastatin  40 mg Oral Daily  . enoxaparin (LOVENOX) injection  40 mg Subcutaneous Q24H  . gabapentin  900 mg Oral TID  . insulin aspart  0-5 Units Subcutaneous QHS  . insulin aspart  0-9 Units Subcutaneous TID WC  . potassium chloride  40 mEq Oral Once    Assessment/Plan: Patient at  baseline.  MRI of the brain reviewed and shows no acute infarcts but shows multiple chronic infarcts in multiple vascular territories.  Echocardiogram shows no cardiac source of emboli with an EF of 60-65%.  A1c 6.0, LDL 92.  EEG normal.  Patient not orthostatic.  Neurontin restarted.  Patient reports that she has not been taking her ASA.  Unclear etiology but unclear if actually a seizure.  TIA remains on the differential..  Recommendations: 1.  Restart ASA 2.  Aggressive lipid management with target LDL<70. 3.  Patient to continue follow up with outpatient neurologist  Case discussed with Dr. Tressia Miners   LOS: 0 days   Alexis Goodell, MD Neurology [phone] 09/19/2017  12:36 PM

## 2017-09-19 NOTE — Consult Note (Signed)
[facility] Consult   Reason for Consult: Depression Referring Physician:  Laneta Simmers, MD  Patient Identification: Jennifer Weiss MRN:  [id number] Principal Diagnosis: Chest pain Diagnosis:   Patient Active Problem List   Diagnosis Date Noted  . Chest pain [R07.9] 09/17/2017  . Loss of consciousness ([place]) [R40.20] 09/17/2017  . Seizures ([place]) [R56.9] 09/17/2017  . Diabetes ([place]) [E11.9] 09/17/2017  . HTN (hypertension) [I10] 09/17/2017  . COPD exacerbation ([place]) [J44.1] 05/29/2017  . Hypoxia [R09.02] 12/31/2016    Total Time spent with patient: 45 minutes  Subjective:   Jaimee Corum is a 56 y.o. female patient admitted with Syncopal episode versus seizure. Psychiatry was consulted secondary to depressive symptoms and concern by her niece for worsening depression. The patient is currently widowed and lives with her niece in the [place] area. She has 1 daughter who lives in [place] with the patient says her relationship with her daughters currently strained. In addition, the patient has had a number of losses. Her son died in a car accident at age of 62 approximately 4 years ago and her husband died approximately 2 or 3 years ago. In addition her sister passed away 3 months ago from cancer. The patient reports feelings of hopelessness, depression and frequent crying spells. She does endorse some anhedonia and low energy level and says that she does not sleep well tonight. She says she has difficulty with both onset of sleep and staying asleep. She denies any current active or passive suicidal thoughts or homicidal thoughts. She denies any prior inpatient psychiatric hospitalizations or suicide attempts. The patient has been on Prozac for many years. She was unable to give a clear history as to how long she had been on the Prozac. She says she is illiterate and cannot read and write and therefore does not know all the medications that she is on. She denies any history of  symptoms consistent with bipolar mania including grandiose delusions, decreased sleep with increased goal directed behavior, hyperreligious thoughts or hypersexual behavior. She denies any history of any psychosis including auditory or visual hallucinations. No paranoid thoughts or delusions. The patient denies any change in appetite, weight gain or weight loss. She denies any history of any heavy alcohol use or illicit drug use. She is currently living with her niece and is upset about the fact that her daughter will not visit her either at home or here in the hospital. She says her daughter does call her on a regular basis however. The patient has not been to any individual therapy or grief counseling.    Past Psychiatric History: The patient has been receiving Prozac from her PCP for many years. She denies any prior inpatient psychiatric hospitalizations or suicide attempts. She has not ever seen a psychiatrist regularly in individual therapy or grief counseling.   Substance abuse history: The patient denies any history of any heavy alcohol use or illicit drug use. She does smoke half a pack of cigarettes per day for over 20 years.  Social history: The patient was born and raised in [place] by both her biological parents. She is illiterate and did not obtain her GED. The patient worked in a [employer] primarily in the past. She is currently widowed and her husband passed away about 2 or 3 years ago. She has one daughter who lives in [place] and 1 son who died about 4 years ago in car accident. She currently lives with her niece in the [place] area.  Legal history:.  The patient denies any history of any prior arrest or incarcerations. She denies any access to guns.     Risk to Self: Is patient at risk for suicide?: No Risk to Others:   No Prior Inpatient Therapy:  No Prior Outpatient Therapy:  No  Past Medical History:  Past Medical History:  Diagnosis Date  . Arthritis   . Asthma    . Cancer ([place])    ovarian  . Carotid arterial disease ([place])    a. 09/2017 Carotid U/S: 50-69% @ R carotid bifurcation. LICA ok.  . Diabetes mellitus without complication ([place])   . Hypertension   . Remote history of stroke   . Seizures ([place])   . Tobacco abuse     Past Surgical History:  Procedure Laterality Date  . none     Family History:  Family History  Problem Relation Age of Onset  . Diabetes Mellitus II Mother    Social History:  History  Alcohol Use No     History  Drug use: Unknown    Social History   Social History  . Marital status: Widowed    Spouse name: N/A  . Number of children: N/A  . Years of education: N/A   Social History Main Topics  . Smoking status: Current Every Day Smoker    Packs/day: 0.50    Types: Cigarettes  . Smokeless tobacco: Never Used  . Alcohol use No  . Drug use: Unknown  . Sexual activity: Not Asked   Other Topics Concern  . None   Social History Narrative   Lives locally with niece.   Additional Social History:    Allergies:   Allergies  Allergen Reactions  . Nystatin Other (See Comments)    Reaction: unknown    Labs:  Results for orders placed or performed during the hospital encounter of 09/17/17 (from the past 48 hour(s))  Basic metabolic panel     Status: Abnormal   Collection Time: 09/17/17  9:02 PM  Result Value Ref Range   Sodium 145 135 - 145 mmol/L   Potassium 3.1 (L) 3.5 - 5.1 mmol/L   Chloride 107 101 - 111 mmol/L   CO2 28 22 - 32 mmol/L   Glucose, Bld 113 (H) 65 - 99 mg/dL   BUN 15 6 - 20 mg/dL   Creatinine, Ser 0.91 0.44 - 1.00 mg/dL   Calcium 9.0 8.9 - 10.3 mg/dL   GFR calc non Af Amer >60 >60 mL/min   GFR calc Af Amer >60 >60 mL/min    Comment: (NOTE) The eGFR has been calculated using the CKD EPI equation. This calculation has not been validated in all clinical situations. eGFR's persistently <60 mL/min signify possible Chronic Kidney Disease.    Anion gap 10 5 - 15  CBC     Status:  Abnormal   Collection Time: 09/17/17  9:02 PM  Result Value Ref Range   WBC 8.7 3.6 - 11.0 K/uL   RBC 4.02 3.80 - 5.20 MIL/uL   Hemoglobin 12.4 12.0 - 16.0 g/dL   HCT 35.8 35.0 - 47.0 %   MCV 89.0 80.0 - 100.0 fL   MCH 30.8 26.0 - 34.0 pg   MCHC 34.6 32.0 - 36.0 g/dL   RDW 15.5 (H) 11.5 - 14.5 %   Platelets 176 150 - 440 K/uL  Hepatic function panel     Status: Abnormal   Collection Time: 09/17/17  9:02 PM  Result Value Ref Range   Total Protein 6.5 6.5 -  8.1 g/dL   Albumin 3.4 (L) 3.5 - 5.0 g/dL   AST 22 15 - 41 U/L   ALT 17 14 - 54 U/L   Alkaline Phosphatase 109 38 - 126 U/L   Total Bilirubin 0.4 0.3 - 1.2 mg/dL   Bilirubin, Direct <0.1 (L) 0.1 - 0.5 mg/dL   Indirect Bilirubin NOT CALCULATED 0.3 - 0.9 mg/dL  Lipase, blood     Status: None   Collection Time: 09/17/17  9:02 PM  Result Value Ref Range   Lipase 24 11 - 51 U/L  Troponin I     Status: None   Collection Time: 09/17/17  9:02 PM  Result Value Ref Range   Troponin I <0.03 <0.03 ng/mL  Troponin I     Status: None   Collection Time: 09/18/17  2:01 AM  Result Value Ref Range   Troponin I <0.03 <0.03 ng/mL  Glucose, capillary     Status: None   Collection Time: 09/18/17  7:25 AM  Result Value Ref Range   Glucose-Capillary 98 65 - 99 mg/dL  Troponin I     Status: None   Collection Time: 09/18/17  8:11 AM  Result Value Ref Range   Troponin I <0.03 <0.03 ng/mL  Basic metabolic panel     Status: Abnormal   Collection Time: 09/18/17  8:11 AM  Result Value Ref Range   Sodium 143 135 - 145 mmol/L   Potassium 3.6 3.5 - 5.1 mmol/L   Chloride 106 101 - 111 mmol/L   CO2 28 22 - 32 mmol/L   Glucose, Bld 112 (H) 65 - 99 mg/dL   BUN 13 6 - 20 mg/dL   Creatinine, Ser 0.85 0.44 - 1.00 mg/dL   Calcium 8.8 (L) 8.9 - 10.3 mg/dL   GFR calc non Af Amer >60 >60 mL/min   GFR calc Af Amer >60 >60 mL/min    Comment: (NOTE) The eGFR has been calculated using the CKD EPI equation. This calculation has not been validated in all  clinical situations. eGFR's persistently <60 mL/min signify possible Chronic Kidney Disease.    Anion gap 9 5 - 15  CBC     Status: Abnormal   Collection Time: 09/18/17  8:11 AM  Result Value Ref Range   WBC 5.5 3.6 - 11.0 K/uL   RBC 4.07 3.80 - 5.20 MIL/uL   Hemoglobin 12.4 12.0 - 16.0 g/dL   HCT 37.2 35.0 - 47.0 %   MCV 91.5 80.0 - 100.0 fL   MCH 30.5 26.0 - 34.0 pg   MCHC 33.3 32.0 - 36.0 g/dL   RDW 15.9 (H) 11.5 - 14.5 %   Platelets 150 150 - 440 K/uL  Hemoglobin A1c     Status: Abnormal   Collection Time: 09/18/17 12:44 PM  Result Value Ref Range   Hgb A1c MFr Bld 6.0 (H) 4.8 - 5.6 %    Comment: (NOTE) Pre diabetes:          5.7%-6.4% Diabetes:              >6.4% Glycemic control for   <7.0% adults with diabetes    Mean Plasma Glucose 125.5 mg/dL    Comment: Performed at [facility], [place] [address] [place], [place] [address]  TSH     Status: None   Collection Time: 09/18/17 12:44 PM  Result Value Ref Range   TSH 0.515 0.350 - 4.500 uIU/mL    Comment: Performed by a 3rd Generation assay with a functional  sensitivity of <=0.01 uIU/mL.  Lipid panel     Status: Abnormal   Collection Time: 09/18/17 12:44 PM  Result Value Ref Range   Cholesterol 143 0 - 200 mg/dL   Triglycerides 87 <150 mg/dL   HDL 34 (L) >40 mg/dL   Total CHOL/HDL Ratio 4.2 RATIO   VLDL 17 0 - 40 mg/dL   LDL Cholesterol 92 0 - 99 mg/dL    Comment:        Total Cholesterol/HDL:CHD Risk Coronary Heart Disease Risk Table                     Men   Women  1/2 Average Risk   3.4   3.3  Average Risk       5.0   4.4  2 X Average Risk   9.6   7.1  3 X Average Risk  23.4   11.0        Use the calculated Patient Ratio above and the CHD Risk Table to determine the patient's CHD Risk.        ATP III CLASSIFICATION (LDL):  <100     mg/dL   Optimal  100-129  mg/dL   Near or Above                    Optimal  130-159  mg/dL   Borderline  160-189  mg/dL   High  >190     mg/dL   Very High    Glucose, capillary     Status: None   Collection Time: 09/18/17 12:53 PM  Result Value Ref Range   Glucose-Capillary 91 65 - 99 mg/dL  Glucose, capillary     Status: None   Collection Time: 09/18/17  4:02 PM  Result Value Ref Range   Glucose-Capillary 87 65 - 99 mg/dL  Glucose, capillary     Status: Abnormal   Collection Time: 09/18/17  9:05 PM  Result Value Ref Range   Glucose-Capillary 137 (H) 65 - 99 mg/dL  Glucose, capillary     Status: Abnormal   Collection Time: 09/19/17  8:06 AM  Result Value Ref Range   Glucose-Capillary 111 (H) 65 - 99 mg/dL  Basic metabolic panel     Status: Abnormal   Collection Time: 09/19/17 11:11 AM  Result Value Ref Range   Sodium 140 135 - 145 mmol/L   Potassium 3.6 3.5 - 5.1 mmol/L   Chloride 104 101 - 111 mmol/L   CO2 28 22 - 32 mmol/L   Glucose, Bld 131 (H) 65 - 99 mg/dL   BUN 14 6 - 20 mg/dL   Creatinine, Ser 0.83 0.44 - 1.00 mg/dL   Calcium 8.8 (L) 8.9 - 10.3 mg/dL   GFR calc non Af Amer >60 >60 mL/min   GFR calc Af Amer >60 >60 mL/min    Comment: (NOTE) The eGFR has been calculated using the CKD EPI equation. This calculation has not been validated in all clinical situations. eGFR's persistently <60 mL/min signify possible Chronic Kidney Disease.    Anion gap 8 5 - 15  Magnesium     Status: None   Collection Time: 09/19/17 11:11 AM  Result Value Ref Range   Magnesium 2.3 1.7 - 2.4 mg/dL  Glucose, capillary     Status: Abnormal   Collection Time: 09/19/17 11:41 AM  Result Value Ref Range   Glucose-Capillary 111 (H) 65 - 99 mg/dL    Current Facility-Administered Medications  Medication Dose Route Frequency  Provider Last Rate Last Dose  . acetaminophen (TYLENOL) tablet 650 mg  650 mg Oral Q6H PRN Lance Coon, MD       Or  . acetaminophen (TYLENOL) suppository 650 mg  650 mg Rectal Q6H PRN Lance Coon, MD      . acyclovir (ZOVIRAX) 200 MG capsule 800 mg  800 mg Oral Daily Lance Coon, MD   800 mg at 09/19/17 9323  .  amLODipine (NORVASC) tablet 5 mg  5 mg Oral Daily Gladstone Lighter, MD   5 mg at 09/19/17 1211  . aspirin EC tablet 81 mg  81 mg Oral Daily Lance Coon, MD   81 mg at 09/19/17 5573  . atorvastatin (LIPITOR) tablet 40 mg  40 mg Oral Daily Lance Coon, MD   40 mg at 09/19/17 2202  . enoxaparin (LOVENOX) injection 40 mg  40 mg Subcutaneous Q24H Lance Coon, MD   40 mg at 09/18/17 2123  . gabapentin (NEURONTIN) tablet 900 mg  900 mg Oral TID Gladstone Lighter, MD   900 mg at 09/19/17 5427  . insulin aspart (novoLOG) injection 0-5 Units  0-5 Units Subcutaneous QHS Lance Coon, MD      . insulin aspart (novoLOG) injection 0-9 Units  0-9 Units Subcutaneous TID WC Lance Coon, MD      . prochlorperazine (COMPAZINE) injection 5 mg  5 mg Intravenous Q6H PRN Lance Coon, MD      . sertraline (ZOLOFT) tablet 25 mg  25 mg Oral Daily Chauncey Mann, MD      . traZODone (DESYREL) tablet 50 mg  50 mg Oral QHS PRN Chauncey Mann, MD        Musculoskeletal: Strength & Muscle Tone: abnormal Gait & Station: unsteady; uses a walker Patient leans: N/A  Psychiatric Specialty Exam: Physical Exam: See Hospitalist note  ROS: See Hospitalist note  Blood pressure (!) 143/72, pulse (!) 58, temperature 97.9 F (36.6 C), temperature source Oral, resp. rate 18, height _0  (1.651 m), weight 100.4 kg (221 lb 4.8 oz), SpO2 98 %.Body mass index is 36.83 kg/m.  General Appearance: Disheveled  Eye Contact:  Fair  Speech:  Normal Rate and Slurred  Volume:  Increased  Mood:  Depressed  Affect:  Crying  Thought Process:  Goal Directed and Linear  Orientation:  Full (Time, Place, and Person)  Thought Content:  Logical  Suicidal Thoughts:  No  Homicidal Thoughts:  No  Memory:  Immediate;   Good Recent;   Good Remote;   Good  Judgement:  Good  Insight:  Good  Psychomotor Activity:  Normal  Concentration:  Concentration: Good and Attention Span: Good  Recall:  Good  Fund of Knowledge:  Good  Language:   Good  Akathisia:  No  Handed:  Right  AIMS (if indicated):     Assets:  Agricultural consultant Housing  ADL's:  Intact  Cognition:  WNL  Sleep:        Treatment Plan Summary:  Ms. Tancredi is a 56 year old African-American female with history of recurrent depression dating back at least for 5 years in the context of multiple losses. Her husband passed approximately 2 or 3 years ago. Her son died in a car accident approximately 4 years ago and her sister just died 3 months ago. Prozac was discontinued to admission secondary to prolonged QTc interval. Patient has not had any improvement on Prozac. She is not currently seeing a psychiatrist or therapist.  Major depressive disorder, recurrent, mild to moderate:  We will plan to start Zoloft 25 mg by mouth daily and titrate as tolerated and needed. Her QTc interval was 409 on 09/19/17. Will plan to start trazodone 50 mg by mouth nightly for insomnia. Will check J75 and folic acid. At this time, the patient is not in imminent danger to herself or others and sustaining inpatient psychiatric hospitalization. Would recommend outpatient psychotropic medication management at [facility]. The patient would also benefit greatly from individual therapy and grief counseling. She was interested in a referral should be made to [facility] after discharge.  At this time, she is not in imminent danger to herself or others and sustaining inpatient psychiatric hospitalization.   Daily contact with patient to assess and evaluate symptoms and progress in treatment and Medication management  Disposition: No evidence of imminent risk to self or others at present.   Patient does not meet criteria for psychiatric inpatient admission.  Jay Schlichter, MD 09/19/2017 1:30 PM as tolerated and needed. He see an herbalist 409 on 09/19/17. We'll plan to start trazodone 50 mg by mouth nightly for insomnia. Will check F01 and folic acid. At this time, the patient  is not in imminent danger to herself or others and sustaining inpatient psychiatric hospitalization. Would recommend outpatient psychotropic medication management at [facility]. The patient would also benefit greatly from

## 2017-09-19 NOTE — Progress Notes (Signed)
Progress Note  Patient Name: Jennifer Weiss Date of Encounter: 09/19/2017  Primary Cardiologist: [place] well this morning. No CP, shortness of breath, palpitations, or lightheadedness.  Inpatient Medications    Scheduled Meds: . acyclovir  800 mg Oral Daily  . aspirin EC  81 mg Oral Daily  . atorvastatin  40 mg Oral Daily  . enoxaparin (LOVENOX) injection  40 mg Subcutaneous Q24H  . gabapentin  900 mg Oral TID  . insulin aspart  0-5 Units Subcutaneous QHS  . insulin aspart  0-9 Units Subcutaneous TID WC   Continuous Infusions:  PRN Meds: acetaminophen **OR** acetaminophen, prochlorperazine   Vital Signs    Vitals:   09/18/17 1755 09/18/17 1939 09/19/17 0459 09/19/17 0735  BP: 127/79 (!) 128/59 (!) 145/77 (!) 152/71  Pulse: 92 (!) 58 (!) 55 (!) 51  Resp:  18 18 18   Temp:  98.8 F (37.1 C) 97.9 F (36.6 C)   TempSrc:  Oral Oral   SpO2: 99% 94% 98% 99%  Weight:      Height:        Intake/Output Summary (Last 24 hours) at 09/19/17 1038 Last data filed at 09/19/17 1009  Gross per 24 hour  Intake              480 ml  Output              650 ml  Net             -170 ml   Filed Weights   09/17/17 2100 09/18/17 0156  Weight: 213 lb (96.6 kg) 221 lb 4.8 oz (100.4 kg)    Telemetry    NSR and sinus bradycardia with occasional PVCs, including runs of bigeminy. No sustained arrhythmias. - Personally Reviewed  ECG    Sinus bradycardia. QT is 440 ms; QTc by my calculation is 423 ms - Personally Reviewed  Physical Exam   GEN: No acute distress.   Neck: No JVD Cardiac: RRR, no murmurs, rubs, or gallops.  Respiratory: Clear to auscultation bilaterally. GI: Soft, nontender, non-distended  MS: No edema; No deformity. Neuro:  Nonfocal  Psych: Normal affect   Labs    Chemistry Recent Labs Lab 09/17/17 2102 09/18/17 0811  NA 145 143  K 3.1* 3.6  CL 107 106  CO2 28 28  GLUCOSE 113* 112*  BUN 15 13  CREATININE 0.91 0.85  CALCIUM  9.0 8.8*  PROT 6.5  --   ALBUMIN 3.4*  --   AST 22  --   ALT 17  --   ALKPHOS 109  --   BILITOT 0.4  --   GFRNONAA >60 >60  GFRAA >60 >60  ANIONGAP 10 9     Hematology Recent Labs Lab 09/17/17 2102 09/18/17 0811  WBC 8.7 5.5  RBC 4.02 4.07  HGB 12.4 12.4  HCT 35.8 37.2  MCV 89.0 91.5  MCH 30.8 30.5  MCHC 34.6 33.3  RDW 15.5* 15.9*  PLT 176 150    Cardiac Enzymes Recent Labs Lab 09/17/17 2102 09/18/17 0201 09/18/17 0811  TROPONINI <0.03 <0.03 <0.03   No results for input(s): TROPIPOC in the last 168 hours.   BNPNo results for input(s): BNP, PROBNP in the last 168 hours.   DDimer No results for input(s): DDIMER in the last 168 hours.   Radiology    Ct Head Wo Contrast  Result Date: 09/17/2017 CLINICAL DATA:  Centralized chest pain, shortness of breath. Family performed  CPR, patient does not recall. History of hypertension and diabetes, ovarian cancer and seizures. EXAM: CT HEAD WITHOUT CONTRAST TECHNIQUE: Contiguous axial images were obtained from the base of the skull through the vertex without intravenous contrast. COMPARISON:  CT HEAD February 15, 2017 FINDINGS: BRAIN: No intraparenchymal hemorrhage, mass effect nor midline shift. Multifocal LEFT frontal encephalomalacia. Small air RIGHT occipital lobe encephalomalacia. Old small bilateral cerebellar infarcts. The ventricles and sulci are normal. Patchy supratentorial Parveen matter hypodensities. No acute large vascular territory infarcts. No abnormal extra-axial fluid collections. Basal cisterns are patent. VASCULAR: Mild calcific atherosclerosis carotid siphons. SKULL/SOFT TISSUES: No skull fracture. No significant soft tissue swelling. ORBITS/SINUSES: The included ocular globes and orbital contents are normal.The mastoid aircells and included paranasal sinuses are well-aerated. OTHER: None. IMPRESSION: 1. No acute intracranial process. 2. 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 Small cerebellar infarcts. 3. Mild to  moderate chronic small vessel ischemic disease. 4. LEFT inferior frontal lobe encephalomalacia is likely posttraumatic. Electronically Signed   By: Elon Alas M.D.   On: 09/17/2017 22:15   Ct Angio Chest Pe W And/or Wo Contrast  Result Date: 09/17/2017 CLINICAL DATA:  Syncopal episode, unresponsive for 1 minutes. Patient does not remember event. Assess for pulmonary embolism. History of ovarian cancer, seizures, hypertension and diabetes. EXAM: CT ANGIOGRAPHY CHEST CT ABDOMEN AND PELVIS WITH CONTRAST TECHNIQUE: Multidetector CT imaging of the chest was performed using the standard protocol during bolus administration of intravenous contrast. Multiplanar CT image reconstructions and MIPs were obtained to evaluate the vascular anatomy. Multidetector CT imaging of the abdomen and pelvis was performed using the standard protocol during bolus administration of intravenous contrast. CONTRAST:  100 cc Isovue 370 COMPARISON:  Chest radiograph May 29, 2017 and CT chest November 09, 2016 FINDINGS: CTA CHEST FINDINGS CARDIOVASCULAR: Adequate contrast opacification of the pulmonary artery's. Main pulmonary artery is not enlarged. No pulmonary arterial filling defects to the level of the subsegmental branches. Heart is mildly enlarged. Mild coronary artery calcification. No pericardial effusion. No pericardial effusion. Thoracic aorta is normal course and caliber, mild calcific atherosclerosis. MEDIASTINUM/NODES: No lymphadenopathy by CT size criteria. LUNGS/PLEURA: Tracheobronchial tree is patent, no pneumothorax. Mild interlobular septal thickening/venous congestion without pleural effusion or focal consolidation. Stable 3 mm sub solid RIGHT upper lobe pulmonary nodule, no routine follow-up indicated. Mild apical bullous changes. MUSCULOSKELETAL: Advanced glenohumeral osteoarthrosis with small RIGHT bursal fluid collection. Mild canal stenosis T5-6 due to calcified ligamentum flavum. Review of the MIP images  confirms the above findings. CT ABDOMEN and PELVIS FINDINGS HEPATOBILIARY: Contracted gallbladder.  Normal liver. PANCREAS: Normal. SPLEEN: Subcentimeter hypodensity in the spleen could reflect cyst or hemangioma. ADRENALS/URINARY TRACT: Kidneys are orthotopic, demonstrating symmetric enhancement. No nephrolithiasis, hydronephrosis or solid renal masses. The unopacified ureters are normal in course and caliber. Delayed imaging through the kidneys demonstrates symmetric prompt contrast excretion within the proximal urinary collecting system. Urinary bladder is partially distended and unremarkable. Normal adrenal glands. STOMACH/BOWEL: The stomach, small and large bowel are normal in course and caliber without inflammatory changes, sensitivity decreased without oral contrast. Mild sigmoid diverticulosis. Normal appendix. VASCULAR/LYMPHATIC: Aortoiliac vessels are normal in course and caliber. Mild calcific atherosclerosis. No lymphadenopathy by CT size criteria. REPRODUCTIVE: Normal. Vaginal wall asymmetry on axial radiographs not apparent on coronal, likely due to obliquity in scanner. OTHER: No intraperitoneal free fluid or free air. MUSCULOSKELETAL: Nonacute. Severe lower lumbar facet arthropathy. Grade 1 L5-S1 anterolisthesis on degenerative basis resulting in severe L5-S1 neural foraminal narrowing. Small to moderate fat containing supraumbilical  ventral hernia. IMPRESSION: CTA CHEST: 1. No acute pulmonary embolism. 2. Mild cardiomegaly and mild pulmonary edema. CT ABDOMEN AND PELVIS: 1. No acute abdominopelvic process. 2. Grade 1 L5-S1 anterolisthesis on degenerative basis with severe bilateral L5-S1 neural foraminal narrowing. Aortic Atherosclerosis (ICD10-I70.0). Electronically Signed   By: Elon Alas M.D.   On: 09/17/2017 23:25   Mr Brain Wo Contrast  Result Date: 09/18/2017 CLINICAL DATA:  Recurrent syncope. Staring spells. History of seizures on unknown medication. EXAM: MRI HEAD WITHOUT CONTRAST  TECHNIQUE: Multiplanar, multiecho pulse sequences of the brain and surrounding structures were obtained without intravenous contrast. COMPARISON:  Head CT from yesterday FINDINGS: Brain: Small to moderate cortically based remote infarcts along the left frontal convexity and in the inferior right occipital temporal cortex. Small remote bilateral cerebellar infarcts. Overall normal brain volume. No acute infarct, hemorrhage, hydrocephalus, or masslike finding. Vascular: Major vessels are patent Skull and upper cervical spine: Negative for marrow lesion Sinuses/Orbits: Cervical facet spurring on the left. IMPRESSION: 1. No acute finding. 2. Cluster of small to moderate remote infarcts along the left frontal convexity. Small remote right temporal occipital infarct. These infarcts cause multifocal cortical gliosis which could predispose to seizures. 3. Small remote bilateral cerebellar infarcts. Electronically Signed   By: Monte Fantasia M.D.   On: 09/18/2017 15:37   Ct Abdomen Pelvis W Contrast  Result Date: 09/17/2017 CLINICAL DATA:  Syncopal episode, unresponsive for 1 minutes. Patient does not remember event. Assess for pulmonary embolism. History of ovarian cancer, seizures, hypertension and diabetes. EXAM: CT ANGIOGRAPHY CHEST CT ABDOMEN AND PELVIS WITH CONTRAST TECHNIQUE: Multidetector CT imaging of the chest was performed using the standard protocol during bolus administration of intravenous contrast. Multiplanar CT image reconstructions and MIPs were obtained to evaluate the vascular anatomy. Multidetector CT imaging of the abdomen and pelvis was performed using the standard protocol during bolus administration of intravenous contrast. CONTRAST:  100 cc Isovue 370 COMPARISON:  Chest radiograph May 29, 2017 and CT chest November 09, 2016 FINDINGS: CTA CHEST FINDINGS CARDIOVASCULAR: Adequate contrast opacification of the pulmonary artery's. Main pulmonary artery is not enlarged. No pulmonary arterial filling  defects to the level of the subsegmental branches. Heart is mildly enlarged. Mild coronary artery calcification. No pericardial effusion. No pericardial effusion. Thoracic aorta is normal course and caliber, mild calcific atherosclerosis. MEDIASTINUM/NODES: No lymphadenopathy by CT size criteria. LUNGS/PLEURA: Tracheobronchial tree is patent, no pneumothorax. Mild interlobular septal thickening/venous congestion without pleural effusion or focal consolidation. Stable 3 mm sub solid RIGHT upper lobe pulmonary nodule, no routine follow-up indicated. Mild apical bullous changes. MUSCULOSKELETAL: Advanced glenohumeral osteoarthrosis with small RIGHT bursal fluid collection. Mild canal stenosis T5-6 due to calcified ligamentum flavum. Review of the MIP images confirms the above findings. CT ABDOMEN and PELVIS FINDINGS HEPATOBILIARY: Contracted gallbladder.  Normal liver. PANCREAS: Normal. SPLEEN: Subcentimeter hypodensity in the spleen could reflect cyst or hemangioma. ADRENALS/URINARY TRACT: Kidneys are orthotopic, demonstrating symmetric enhancement. No nephrolithiasis, hydronephrosis or solid renal masses. The unopacified ureters are normal in course and caliber. Delayed imaging through the kidneys demonstrates symmetric prompt contrast excretion within the proximal urinary collecting system. Urinary bladder is partially distended and unremarkable. Normal adrenal glands. STOMACH/BOWEL: The stomach, small and large bowel are normal in course and caliber without inflammatory changes, sensitivity decreased without oral contrast. Mild sigmoid diverticulosis. Normal appendix. VASCULAR/LYMPHATIC: Aortoiliac vessels are normal in course and caliber. Mild calcific atherosclerosis. No lymphadenopathy by CT size criteria. REPRODUCTIVE: Normal. Vaginal wall asymmetry on axial radiographs not apparent on coronal, likely due  to obliquity in scanner. OTHER: No intraperitoneal free fluid or free air. MUSCULOSKELETAL: Nonacute.  Severe lower lumbar facet arthropathy. Grade 1 L5-S1 anterolisthesis on degenerative basis resulting in severe L5-S1 neural foraminal narrowing. Small to moderate fat containing supraumbilical ventral hernia. IMPRESSION: CTA CHEST: 1. No acute pulmonary embolism. 2. Mild cardiomegaly and mild pulmonary edema. CT ABDOMEN AND PELVIS: 1. No acute abdominopelvic process. 2. Grade 1 L5-S1 anterolisthesis on degenerative basis with severe bilateral L5-S1 neural foraminal narrowing. Aortic Atherosclerosis (ICD10-I70.0). Electronically Signed   By: Elon Alas M.D.   On: 09/17/2017 23:25   US Carotid Bilateral  Result Date: 09/18/2017 CLINICAL DATA:  56 year old female with a history of syncope. Cardiovascular risk factors include hypertension, hyperlipidemia, diabetes, tobacco EXAM: BILATERAL CAROTID DUPLEX ULTRASOUND TECHNIQUE: Pearline Cables scale imaging, color Doppler and duplex ultrasound were performed of bilateral carotid and vertebral arteries in the neck. COMPARISON:  No prior duplex FINDINGS: Criteria: Quantification of carotid stenosis is based on velocity parameters that correlate the residual internal carotid diameter with NASCET-based stenosis levels, using the diameter of the distal internal carotid lumen as the denominator for stenosis measurement. The following velocity measurements were obtained: RIGHT ICA:  Systolic 850 cm/sec, Diastolic 45 cm/sec CCA:  63 cm/sec SYSTOLIC ICA/CCA RATIO:  2.0 ECA:  73 cm/sec LEFT ICA:  Systolic 277 cm/sec, Diastolic 31 cm/sec CCA:  62 cm/sec SYSTOLIC ICA/CCA RATIO:  1.9 ECA:  66 cm/sec Right Brachial SBP: Not acquired Left Brachial SBP: Not acquired RIGHT CAROTID ARTERY: No significant calcifications of the right common carotid artery. Intermediate waveform maintained. Heterogeneous and partially calcified plaque at the right carotid bifurcation. No significant lumen shadowing. Low resistance waveform of the right ICA. No significant tortuosity. RIGHT VERTEBRAL ARTERY:  Antegrade flow with low resistance waveform. LEFT CAROTID ARTERY: No significant calcifications of the left common carotid artery. Intermediate waveform maintained. Heterogeneous and partially calcified plaque at the left carotid bifurcation without significant lumen shadowing. Low resistance waveform of the left ICA. No significant tortuosity. LEFT VERTEBRAL ARTERY:  Antegrade flow with low resistance waveform. IMPRESSION: Right: Heterogeneous plaque at the carotid bifurcation, with discordant results regarding degree of stenosis by established duplex criteria. Peak velocity suggests no significant stenosis, with the ICA/ CCA ratio suggesting 50%- 69% stenosis. If establishing a more accurate degree of stenosis is required, cerebral angiogram should be considered, or as a second best test, CTA. Left: Color duplex indicates minimal heterogeneous and calcified plaque, with no hemodynamically significant stenosis by duplex criteria in the left extracranial cerebrovascular circulation. Signed, Dulcy Fanny. Earleen Newport, DO [facility] Radiology Electronically Signed   By: Corrie Mckusick D.O.   On: 09/18/2017 10:15    Cardiac Studies   Echo (09/18/17): - Left ventricle: The cavity size was normal. Wall thickness was   increased in a pattern of mild LVH. Systolic function was normal.   The estimated ejection fraction was in the range of 60% to 65%.   Wall motion was normal; there were no regional wall motion   abnormalities. Doppler parameters are consistent with abnormal   left ventricular relaxation (grade 1 diastolic dysfunction). - Mitral valve: There was mild regurgitation. - Left atrium: The atrium was mildly dilated. - Right ventricle: The cavity size was normal. Systolic function   was normal. - Tricuspid valve: There was mild-moderate regurgitation.  Patient Profile     56 y.o. female with history of remote CVA, HTN, DM, COPD, tobacco abuse, and ovarian  cancer, admitted with episode of chest pain questionable syncope  for which she received brief bystander CPR.  Assessment & Plan    Chest pain Symptoms have resolved. Pain was atypical; as the patient describes a sharp, right-sided pain that began while she was driving. Troponin has been negative. There are no ischemic EKG changes. Echo shows normal LV contraction without wall motion abnormalities. CTA chest shows mild aortic calcification near the RCA but no obvious coronary artery calcification.  It is reasonable to obtain a myocardial perfusion stress test for further evaluation; this could be obtained as an outpatient if the patient is otherwise stable for discharge over the weekend.  Continue primary prevention, including aspirin and atorvastatin.  Syncope Circumstances surround event are unclear. There is question about past seizure activity. EEG yesterday did not show any abnormalities. MRI brain showed multiple remote infarcts but no acute findings. Initial EKGs suggested marked QT-prolongation, though low amplitude T-waves makes assessment somewhat difficult. Prozac has been discontinued. EKG today shows sinus bradycardia with normal QT/QTc.  Continue to avoid QT-prolonging drugs.  Avoid heart-rate suppressing medications, including beta-blockers and calcium channel blockers.  Check potassium and magnesium levels today. These should be repleted to maintain K and Mg greater than 4.0 and 2.0, respectively.  Recommend 30-day event monitor as an outpatient to evaluate for other arrhythmias. Given evidence of multiple prior infarcts, paroxysmal atrial fibrillation will need to be excluded.  Further neuro work-up per primary service.  History of stroke No acute findings on MRI.  Continue aspirin and atorvastatin.  Dispo: F/u electrolytes and replete as needed. No further inpatient cardiac workup recommended. Patient to f/u with Dr. Rockey Situ after completion of stress test and event  monitor.     Signed, Nelva Bush, MD  09/19/2017, 10:38 AM

## 2017-09-19 NOTE — Care Management Note (Signed)
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 She did not have a choice of providers so a referral for home health PT and RN was called to [place] [facility]. Brenton Grills was provided with a second home contact number of niece [name] [phone]. This Probation officer requested that [facility] discuss any insurance co-pay's with [name] prior to the first home visit.                 Action/Plan:   Expected Discharge Date:  09/20/17               Expected Discharge Plan:  [place]  In-House Referral:  NA  Discharge planning Services  NA  Post Acute Care Choice:  NA Choice offered to:  Patient  DME Arranged:   (Has a RW and a home nebulizer) DME Agency:  NA  HH Arranged:  PT, RN [place] Agency:  [place]  Status of Service:  Completed, signed off  If discussed at [place] of Stay Meetings, dates discussed:    Additional Comments:  Yochanan Eddleman A, RN 09/19/2017, 3:02 PM

## 2017-09-19 NOTE — Progress Notes (Signed)
[facility] NAME: Jennifer Weiss    MR#:  [id number]  DATE OF BIRTH:  [birth year]  SUBJECTIVE:  CHIEF COMPLAINT:   Chief Complaint  Patient presents with  . Chest Pain  . Loss of Consciousness   - Admitted with an unresponsive episode, likely seizure versus syncope. -awaiting neuro follow-up today. Also has some depression symptoms  REVIEW OF SYSTEMS:  Review of Systems  Constitutional: Positive for malaise/fatigue. Negative for chills and fever.  HENT: Negative for congestion, ear pain, hearing loss and nosebleeds.   Respiratory: Negative for cough, shortness of breath and wheezing.   Cardiovascular: Negative for chest pain, palpitations, claudication and leg swelling.  Gastrointestinal: Negative for abdominal pain, constipation, diarrhea, nausea and vomiting.  Genitourinary: Negative for dysuria.  Musculoskeletal: Positive for back pain.  Neurological: Positive for seizures. Negative for dizziness, sensory change, speech change, focal weakness and headaches.    DRUG ALLERGIES:   Allergies  Allergen Reactions  . Nystatin Other (See Comments)    Reaction: unknown    VITALS:  Blood pressure (!) 152/71, pulse (!) 51, temperature 97.9 F (36.6 C), temperature source Oral, resp. rate 18, height 5\' 5"  (1.651 m), weight 100.4 kg (221 lb 4.8 oz), SpO2 99 %.  PHYSICAL EXAMINATION:  Physical Exam  GENERAL:  56 y.o.-year-old patient Sitting in the bed with no acute distress. Disheveled appearing EYES: Pupils equal, round, reactive to light and accommodation. No scleral icterus. Extraocular muscles intact.  HEENT: Head atraumatic, normocephalic. Oropharynx and nasopharynx clear.  NECK:  Supple, no jugular venous distention. No thyroid enlargement, no tenderness.  LUNGS: Normal breath sounds bilaterally, no wheezing, rales,rhonchi or crepitation. No use of accessory muscles of respiration. Decreased bibasilar breath  sounds CARDIOVASCULAR: S1, S2 normal. No murmurs, rubs, or gallops.  ABDOMEN: Soft, nontender, nondistended. Bowel sounds present. No organomegaly or mass.  EXTREMITIES: No pedal edema, cyanosis, or clubbing.  NEUROLOGIC: Cranial nerves II through XII are intact. Muscle strength 5/5 in all extremities. Sensation intact. Gait not checked. Some stuttering of her speech noted. Generalized weakness noted. PSYCHIATRIC: The patient is alert and oriented x 2-3.  SKIN: No obvious rash, lesion, or ulcer.    LABORATORY PANEL:   CBC  Recent Labs Lab 09/18/17 0811  WBC 5.5  HGB 12.4  HCT 37.2  PLT 150   ------------------------------------------------------------------------------------------------------------------  Chemistries   Recent Labs Lab 09/17/17 2102 09/18/17 0811  NA 145 143  K 3.1* 3.6  CL 107 106  CO2 28 28  GLUCOSE 113* 112*  BUN 15 13  CREATININE 0.91 0.85  CALCIUM 9.0 8.8*  AST 22  --   ALT 17  --   ALKPHOS 109  --   BILITOT 0.4  --    ------------------------------------------------------------------------------------------------------------------  Cardiac Enzymes  Recent Labs Lab 09/18/17 0811  TROPONINI <0.03   ------------------------------------------------------------------------------------------------------------------  RADIOLOGY:  Ct Head Wo Contrast  Result Date: 09/17/2017 CLINICAL DATA:  Centralized chest pain, shortness of breath. Family performed CPR, patient does not recall. History of hypertension and diabetes, ovarian cancer and seizures. EXAM: CT HEAD WITHOUT CONTRAST TECHNIQUE: Contiguous axial images were obtained from the base of the skull through the vertex without intravenous contrast. COMPARISON:  CT HEAD February 15, 2017 FINDINGS: BRAIN: No intraparenchymal hemorrhage, mass effect nor midline shift. Multifocal LEFT frontal encephalomalacia. Small air RIGHT occipital lobe encephalomalacia. Old small bilateral cerebellar infarcts. The  ventricles and sulci are normal. Patchy supratentorial Yingling matter hypodensities. No acute large vascular territory infarcts.  No abnormal extra-axial fluid collections. Basal cisterns are patent. VASCULAR: Mild calcific atherosclerosis carotid siphons. SKULL/SOFT TISSUES: No skull fracture. No significant soft tissue swelling. ORBITS/SINUSES: The included ocular globes and orbital contents are normal.The mastoid aircells and included paranasal sinuses are well-aerated. OTHER: None. IMPRESSION: 1. No acute intracranial process. 2. 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 Small cerebellar infarcts. 3. Mild to moderate chronic small vessel ischemic disease. 4. LEFT inferior frontal lobe encephalomalacia is likely posttraumatic. Electronically Signed   By: Elon Alas M.D.   On: 09/17/2017 22:15   Ct Angio Chest Pe W And/or Wo Contrast  Result Date: 09/17/2017 CLINICAL DATA:  Syncopal episode, unresponsive for 1 minutes. Patient does not remember event. Assess for pulmonary embolism. History of ovarian cancer, seizures, hypertension and diabetes. EXAM: CT ANGIOGRAPHY CHEST CT ABDOMEN AND PELVIS WITH CONTRAST TECHNIQUE: Multidetector CT imaging of the chest was performed using the standard protocol during bolus administration of intravenous contrast. Multiplanar CT image reconstructions and MIPs were obtained to evaluate the vascular anatomy. Multidetector CT imaging of the abdomen and pelvis was performed using the standard protocol during bolus administration of intravenous contrast. CONTRAST:  100 cc Isovue 370 COMPARISON:  Chest radiograph May 29, 2017 and CT chest November 09, 2016 FINDINGS: CTA CHEST FINDINGS CARDIOVASCULAR: Adequate contrast opacification of the pulmonary artery's. Main pulmonary artery is not enlarged. No pulmonary arterial filling defects to the level of the subsegmental branches. Heart is mildly enlarged. Mild coronary artery calcification. No pericardial effusion. No  pericardial effusion. Thoracic aorta is normal course and caliber, mild calcific atherosclerosis. MEDIASTINUM/NODES: No lymphadenopathy by CT size criteria. LUNGS/PLEURA: Tracheobronchial tree is patent, no pneumothorax. Mild interlobular septal thickening/venous congestion without pleural effusion or focal consolidation. Stable 3 mm sub solid RIGHT upper lobe pulmonary nodule, no routine follow-up indicated. Mild apical bullous changes. MUSCULOSKELETAL: Advanced glenohumeral osteoarthrosis with small RIGHT bursal fluid collection. Mild canal stenosis T5-6 due to calcified ligamentum flavum. Review of the MIP images confirms the above findings. CT ABDOMEN and PELVIS FINDINGS HEPATOBILIARY: Contracted gallbladder.  Normal liver. PANCREAS: Normal. SPLEEN: Subcentimeter hypodensity in the spleen could reflect cyst or hemangioma. ADRENALS/URINARY TRACT: Kidneys are orthotopic, demonstrating symmetric enhancement. No nephrolithiasis, hydronephrosis or solid renal masses. The unopacified ureters are normal in course and caliber. Delayed imaging through the kidneys demonstrates symmetric prompt contrast excretion within the proximal urinary collecting system. Urinary bladder is partially distended and unremarkable. Normal adrenal glands. STOMACH/BOWEL: The stomach, small and large bowel are normal in course and caliber without inflammatory changes, sensitivity decreased without oral contrast. Mild sigmoid diverticulosis. Normal appendix. VASCULAR/LYMPHATIC: Aortoiliac vessels are normal in course and caliber. Mild calcific atherosclerosis. No lymphadenopathy by CT size criteria. REPRODUCTIVE: Normal. Vaginal wall asymmetry on axial radiographs not apparent on coronal, likely due to obliquity in scanner. OTHER: No intraperitoneal free fluid or free air. MUSCULOSKELETAL: Nonacute. Severe lower lumbar facet arthropathy. Grade 1 L5-S1 anterolisthesis on degenerative basis resulting in severe L5-S1 neural foraminal narrowing.  Small to moderate fat containing supraumbilical ventral hernia. IMPRESSION: CTA CHEST: 1. No acute pulmonary embolism. 2. Mild cardiomegaly and mild pulmonary edema. CT ABDOMEN AND PELVIS: 1. No acute abdominopelvic process. 2. Grade 1 L5-S1 anterolisthesis on degenerative basis with severe bilateral L5-S1 neural foraminal narrowing. Aortic Atherosclerosis (ICD10-I70.0). Electronically Signed   By: Elon Alas M.D.   On: 09/17/2017 23:25   Mr Brain Wo Contrast  Result Date: 09/18/2017 CLINICAL DATA:  Recurrent syncope. Staring spells. History of seizures on unknown medication. EXAM: MRI HEAD WITHOUT CONTRAST  TECHNIQUE: Multiplanar, multiecho pulse sequences of the brain and surrounding structures were obtained without intravenous contrast. COMPARISON:  Head CT from yesterday FINDINGS: Brain: Small to moderate cortically based remote infarcts along the left frontal convexity and in the inferior right occipital temporal cortex. Small remote bilateral cerebellar infarcts. Overall normal brain volume. No acute infarct, hemorrhage, hydrocephalus, or masslike finding. Vascular: Major vessels are patent Skull and upper cervical spine: Negative for marrow lesion Sinuses/Orbits: Cervical facet spurring on the left. IMPRESSION: 1. No acute finding. 2. Cluster of small to moderate remote infarcts along the left frontal convexity. Small remote right temporal occipital infarct. These infarcts cause multifocal cortical gliosis which could predispose to seizures. 3. Small remote bilateral cerebellar infarcts. Electronically Signed   By: Monte Fantasia M.D.   On: 09/18/2017 15:37   Ct Abdomen Pelvis W Contrast  Result Date: 09/17/2017 CLINICAL DATA:  Syncopal episode, unresponsive for 1 minutes. Patient does not remember event. Assess for pulmonary embolism. History of ovarian cancer, seizures, hypertension and diabetes. EXAM: CT ANGIOGRAPHY CHEST CT ABDOMEN AND PELVIS WITH CONTRAST TECHNIQUE: Multidetector CT  imaging of the chest was performed using the standard protocol during bolus administration of intravenous contrast. Multiplanar CT image reconstructions and MIPs were obtained to evaluate the vascular anatomy. Multidetector CT imaging of the abdomen and pelvis was performed using the standard protocol during bolus administration of intravenous contrast. CONTRAST:  100 cc Isovue 370 COMPARISON:  Chest radiograph May 29, 2017 and CT chest November 09, 2016 FINDINGS: CTA CHEST FINDINGS CARDIOVASCULAR: Adequate contrast opacification of the pulmonary artery's. Main pulmonary artery is not enlarged. No pulmonary arterial filling defects to the level of the subsegmental branches. Heart is mildly enlarged. Mild coronary artery calcification. No pericardial effusion. No pericardial effusion. Thoracic aorta is normal course and caliber, mild calcific atherosclerosis. MEDIASTINUM/NODES: No lymphadenopathy by CT size criteria. LUNGS/PLEURA: Tracheobronchial tree is patent, no pneumothorax. Mild interlobular septal thickening/venous congestion without pleural effusion or focal consolidation. Stable 3 mm sub solid RIGHT upper lobe pulmonary nodule, no routine follow-up indicated. Mild apical bullous changes. MUSCULOSKELETAL: Advanced glenohumeral osteoarthrosis with small RIGHT bursal fluid collection. Mild canal stenosis T5-6 due to calcified ligamentum flavum. Review of the MIP images confirms the above findings. CT ABDOMEN and PELVIS FINDINGS HEPATOBILIARY: Contracted gallbladder.  Normal liver. PANCREAS: Normal. SPLEEN: Subcentimeter hypodensity in the spleen could reflect cyst or hemangioma. ADRENALS/URINARY TRACT: Kidneys are orthotopic, demonstrating symmetric enhancement. No nephrolithiasis, hydronephrosis or solid renal masses. The unopacified ureters are normal in course and caliber. Delayed imaging through the kidneys demonstrates symmetric prompt contrast excretion within the proximal urinary collecting system.  Urinary bladder is partially distended and unremarkable. Normal adrenal glands. STOMACH/BOWEL: The stomach, small and large bowel are normal in course and caliber without inflammatory changes, sensitivity decreased without oral contrast. Mild sigmoid diverticulosis. Normal appendix. VASCULAR/LYMPHATIC: Aortoiliac vessels are normal in course and caliber. Mild calcific atherosclerosis. No lymphadenopathy by CT size criteria. REPRODUCTIVE: Normal. Vaginal wall asymmetry on axial radiographs not apparent on coronal, likely due to obliquity in scanner. OTHER: No intraperitoneal free fluid or free air. MUSCULOSKELETAL: Nonacute. Severe lower lumbar facet arthropathy. Grade 1 L5-S1 anterolisthesis on degenerative basis resulting in severe L5-S1 neural foraminal narrowing. Small to moderate fat containing supraumbilical ventral hernia. IMPRESSION: CTA CHEST: 1. No acute pulmonary embolism. 2. Mild cardiomegaly and mild pulmonary edema. CT ABDOMEN AND PELVIS: 1. No acute abdominopelvic process. 2. Grade 1 L5-S1 anterolisthesis on degenerative basis with severe bilateral L5-S1 neural foraminal narrowing. Aortic Atherosclerosis (ICD10-I70.0). Electronically Signed  By: Elon Alas M.D.   On: 09/17/2017 23:25   US Carotid Bilateral  Result Date: 09/18/2017 CLINICAL DATA:  56 year old female with a history of syncope. Cardiovascular risk factors include hypertension, hyperlipidemia, diabetes, tobacco EXAM: BILATERAL CAROTID DUPLEX ULTRASOUND TECHNIQUE: Pearline Cables scale imaging, color Doppler and duplex ultrasound were performed of bilateral carotid and vertebral arteries in the neck. COMPARISON:  No prior duplex FINDINGS: Criteria: Quantification of carotid stenosis is based on velocity parameters that correlate the residual internal carotid diameter with NASCET-based stenosis levels, using the diameter of the distal internal carotid lumen as the denominator for stenosis measurement. The following velocity measurements  were obtained: RIGHT ICA:  Systolic 465 cm/sec, Diastolic 45 cm/sec CCA:  63 cm/sec SYSTOLIC ICA/CCA RATIO:  2.0 ECA:  73 cm/sec LEFT ICA:  Systolic 035 cm/sec, Diastolic 31 cm/sec CCA:  62 cm/sec SYSTOLIC ICA/CCA RATIO:  1.9 ECA:  66 cm/sec Right Brachial SBP: Not acquired Left Brachial SBP: Not acquired RIGHT CAROTID ARTERY: No significant calcifications of the right common carotid artery. Intermediate waveform maintained. Heterogeneous and partially calcified plaque at the right carotid bifurcation. No significant lumen shadowing. Low resistance waveform of the right ICA. No significant tortuosity. RIGHT VERTEBRAL ARTERY: Antegrade flow with low resistance waveform. LEFT CAROTID ARTERY: No significant calcifications of the left common carotid artery. Intermediate waveform maintained. Heterogeneous and partially calcified plaque at the left carotid bifurcation without significant lumen shadowing. Low resistance waveform of the left ICA. No significant tortuosity. LEFT VERTEBRAL ARTERY:  Antegrade flow with low resistance waveform. IMPRESSION: Right: Heterogeneous plaque at the carotid bifurcation, with discordant results regarding degree of stenosis by established duplex criteria. Peak velocity suggests no significant stenosis, with the ICA/ CCA ratio suggesting 50%- 69% stenosis. If establishing a more accurate degree of stenosis is required, cerebral angiogram should be considered, or as a second best test, CTA. Left: Color duplex indicates minimal heterogeneous and calcified plaque, with no hemodynamically significant stenosis by duplex criteria in the left extracranial cerebrovascular circulation. Signed, Dulcy Fanny. Earleen Newport, DO [facility] Electronically Signed   By: Corrie Mckusick D.O.   On: 09/18/2017 10:15    EKG:   Orders placed or performed during the hospital encounter of 09/17/17  . EKG 12-Lead  . EKG 12-Lead  . ED EKG  . ED EKG  . EKG  12-Lead  . EKG 12-Lead    ASSESSMENT AND PLAN:   56 year old female with past medical history significant for prior stroke, COPD not on home oxygen, diabetes, hypertension, depression, seizure disorder brought to the hospital secondary to a syncopal episode versus seizure episode.  #1 Staring spells -with known history of seizure disorder. -Appreciate neurology consult. -MRI of the brain without any new infarcts, but patient does have old clusters of infarcts along left frontal convexity. -Outpatient 30 day Holter monitor to rule out underlying atrial fibrillation neck and left and continue aspirin and statin for now. -EEG with no acute seizures  -Neurontin restarted  #2 chest pain-right-sided chest pain, appreciate cardiology consult. Troponins are negative -No ischemic changes on EKG. Echo shows normal LV function. -Outpatient stress test and 30 day Holter monitor recommended by cardiology.  #3 depression-no active suicidal ideation. -Psychiatry consulted. Patient's Prozac discontinued due to prolonged QT  #4 hypertension-hold chlorthalidone due to hypokalemia. Hold atenolol due to bradycardia -Added Norvasc  #5 diabetes mellitus-restart Glucophage at discharge  #6 DVT prophylaxis-Lovenox  Physical therapy consulted. -Niece has been contacted and updated over the phone   All the  records are reviewed and case discussed with Care Management/Social Workerr. Management plans discussed with the patient, family and they are in agreement.  CODE STATUS:Full code  TOTAL TIME TAKING CARE OF THIS PATIENT: 37 minutes.   POSSIBLE D/C today or tomorrow DAYS, DEPENDING ON CLINICAL CONDITION.   Annelies Coyt M.D on 09/19/2017 at 11:25 AM  Between 7am to 6pm - Pager - [phone]  After 6pm go to [email or web address] - password [email or web address] [facility]  Office  [phone]  CC: Primary care physician; [place], [place]

## 2017-09-19 NOTE — Evaluation (Signed)
Physical Therapy Evaluation Patient Details Name: Jennifer Weiss MRN: [id number] DOB: Sep 11, 1961 Today's Date: 09/19/2017   History of Present Illness  Patient is a 56 y/o female that presents after an episode of loss of consciousness, she has a history of seizures. She had cranial imaging taken in the ED, found to have multiple old infarcts, nothing acute. She has been falling frequently at home, and cardiology will follow with Holter monitor.   Clinical Impression  Patient reports she has had multiple falls, unable to provide more insight as it appears she is afraid to tell medical providers about her issues (reports she has had back pain for years but has never told anyone). She is orthostatic on supine to sit transfer, and is symptomatic with dizziness. She begins to lose balance multiple times upon standing/turning when she stands up initially/begins to ambulate. She was provided with a RW, began ambulating and started shaking in her UEs, then started wailing out/crying as she was becoming quite anxious about using a RW. RN came in to assist with getting chair to allow patient to sit, encouraged to try using RW in the hallways. She was able to ambulate in the hallways, however multiple loss of balance episodes even with RW, secondary to her LEs scissoring. She would benefit from short term rehabilitation to increase her safety with mobility given today's evaluation.     Follow Up Recommendations SNF    Equipment Recommendations  Rolling walker with 5" wheels    Recommendations for Other Services       Precautions / Restrictions Precautions Precautions: Fall Restrictions Weight Bearing Restrictions: No      Mobility  Bed Mobility Overal bed mobility: Needs Assistance Bed Mobility: Supine to Sit;Sit to Supine     Supine to sit: Min guard Sit to supine: Min guard   General bed mobility comments: Patient is able to transfer to and from the edge of the bed without physical  assistance aside from guarding.   Transfers Overall transfer level: Needs assistance Equipment used: None Transfers: Sit to/from Stand Sit to Stand: Min guard         General transfer comment: Patient loses balance, requires PT to help correct loss of balance and uses her UEs for support on bed rail.   Ambulation/Gait Ambulation/Gait assistance: Min guard;Min assist Ambulation Distance (Feet): 3 Feet ((200' with RW, described later)) Assistive device: None Gait Pattern/deviations: WFL(Within Functional Limits);Scissoring;Trunk flexed   Gait velocity interpretation: <1.8 ft/sec, indicative of risk for recurrent falls General Gait Details: Intermittent scissoring especially with turns, poor use of RW with RW lateral or anterior to her COM throughout bout. Without RW she had 2 significant losses of balance within 5 feet.  Stairs            Wheelchair Mobility    Modified Rankin (Stroke Patients Only)       Balance Overall balance assessment: Needs assistance;History of Falls Sitting-balance support: No upper extremity supported Sitting balance-Leahy Scale: Good     Standing balance support: No upper extremity supported Standing balance-Leahy Scale: Poor                               Pertinent Vitals/Pain Pain Assessment: Faces Faces Pain Scale: Hurts even more Pain Location: Low back Pain Descriptors / Indicators: Aching Pain Intervention(s): Limited activity within patient's tolerance;Monitored during session    Home Living Family/patient expects to be discharged to:: Private residence Living Arrangements: Other relatives (Lives  with niece) Available Help at Discharge: Available PRN/intermittently Type of Home: House Home Access: Stairs to enter Entrance Stairs-Rails:  (Did not specify which side, but state she does have rails) Entrance Stairs-Number of Steps: 3 Home Layout: One level Home Equipment: Walker - 2 wheels Additional Comments: Per  patient she has been falling just about daily.     Prior Function Level of Independence: Independent         Comments: Patient reports she has a RW at home, but doesn't use it.      Hand Dominance        Extremity/Trunk Assessment   Upper Extremity Assessment Upper Extremity Assessment: Overall WFL for tasks assessed    Lower Extremity Assessment Lower Extremity Assessment: Overall WFL for tasks assessed       Communication   Communication: No difficulties  Cognition Arousal/Alertness: Awake/alert Behavior During Therapy: Impulsive;Anxious Overall Cognitive Status: Impaired/Different from baseline                                 General Comments: Patient ambulated towards door and became very emotional, screaming out, crying and shaking her arms.       General Comments General comments (skin integrity, edema, etc.): Modified DGI with RW 9/12     Exercises Other Exercises Other Exercises: Patient ambulated with RW ~ 200' with 3 separate incidents where she lost balance and required PT assistance to correct. Educated and frequently reinforced need for patient to keep her feet within RW and not cross them.   Assessment/Plan    PT Assessment Patient needs continued PT services  PT Problem List Decreased strength;Decreased activity tolerance;Decreased cognition;Pain;Decreased knowledge of use of DME;Decreased knowledge of precautions;Decreased mobility;Decreased safety awareness       PT Treatment Interventions DME instruction;Therapeutic activities;Cognitive remediation;Therapeutic exercise;Gait training;Stair training;Balance training;Patient/family education;Neuromuscular re-education;Functional mobility training    PT Goals (Current goals can be found in the Care Plan section)  Acute Rehab PT Goals Patient Stated Goal: To return home  PT Goal Formulation: With patient Time For Goal Achievement: 10/03/17 Potential to Achieve Goals: Fair     Frequency Min 2X/week   Barriers to discharge        Co-evaluation               AM-PAC PT "6 Clicks" Daily Activity  Outcome Measure Difficulty turning over in bed (including adjusting bedclothes, sheets and blankets)?: A Little Difficulty moving from lying on back to sitting on the side of the bed? : A Little Difficulty sitting down on and standing up from a chair with arms (e.g., wheelchair, bedside commode, etc,.)?: A Little Help needed moving to and from a bed to chair (including a wheelchair)?: A Lot Help needed walking in hospital room?: A Lot Help needed climbing 3-5 steps with a railing? : Total 6 Click Score: 14    End of Session Equipment Utilized During Treatment: Gait belt Activity Tolerance: Patient tolerated treatment well;Other (comment) (Limited by emotional outburst) Patient left: in bed;with bed alarm set;with call bell/phone within reach;with family/visitor present Nurse Communication: Mobility status PT Visit Diagnosis: Repeated falls (R29.6);Difficulty in walking, not elsewhere classified (R26.2)    Time: 0175-1025 PT Time Calculation (min) (ACUTE ONLY): 28 min   Charges:   PT Evaluation $PT Eval Moderate Complexity: 1 Mod PT Treatments $Gait Training: 8-22 mins   PT G Codes:   PT G-Codes **NOT FOR INPATIENT CLASS** Functional Assessment Tool Used: AM-PAC 6  Clicks Basic Mobility;Clinical judgement Functional Limitation: Mobility: Walking and moving around Mobility: Walking and Moving Around Current Status [phone]): At least 20 percent but less than 40 percent impaired, limited or restricted Mobility: Walking and Moving Around Goal Status [phone]): At least 1 percent but less than 20 percent impaired, limited or restricted   Royce Macadamia PT, DPT, CSCS    09/19/2017, 4:12 PM

## 2017-09-20 LAB — GLUCOSE, CAPILLARY
GLUCOSE-CAPILLARY: 112 mg/dL — AB (ref 65–99)
Glucose-Capillary: 107 mg/dL — ABNORMAL HIGH (ref 65–99)

## 2017-09-20 MED ORDER — AMLODIPINE BESYLATE 5 MG PO TABS: 5.0000 mg | ORAL_TABLET | Freq: Every day | ORAL | 2 refills | Status: DC

## 2017-09-20 MED ORDER — TRAZODONE HCL 50 MG PO TABS: 50.0000 mg | ORAL_TABLET | Freq: Every evening | ORAL | 2 refills | Status: DC | PRN

## 2017-09-20 MED ORDER — SERTRALINE HCL 25 MG PO TABS: 25.0000 mg | ORAL_TABLET | Freq: Every day | ORAL | 2 refills | Status: DC

## 2017-09-20 NOTE — Discharge Summary (Signed)
[name] NAME: Jennifer Weiss    MR#:  [id number]  DATE OF BIRTH:  December 29, 1960  DATE OF ADMISSION:  09/17/2017   ADMITTING PHYSICIAN: Lance Coon, MD  DATE OF DISCHARGE: 09/20/17  PRIMARY CARE PHYSICIAN: [place], [place] DIAGNOSIS:   Generalized abdominal pain [R10.84] Prolonged Q-T interval on ECG [R94.31] Syncope, unspecified syncope type [R55] Chest pain, unspecified type [R07.9]  DISCHARGE DIAGNOSIS:   Principal Problem:   Chest pain Active Problems:   Loss of consciousness (HCC)   Seizures (HCC)   Diabetes (HCC)   HTN (hypertension)   Moderate episode of recurrent major depressive disorder ([place])   SECONDARY DIAGNOSIS:   Past Medical History:  Diagnosis Date  . Arthritis   . Asthma   . Cancer ([place])    ovarian  . Carotid arterial disease ([place])    a. 09/2017 Carotid U/S: 50-69% @ R carotid bifurcation. LICA ok.  . Diabetes mellitus without complication ([place])   . Hypertension   . Remote history of stroke   . Seizures ([place])   . Tobacco abuse     HOSPITAL COURSE:   56 year old female with past medical history significant for prior stroke, COPD not on home oxygen, diabetes, hypertension, depression, seizure disorder brought to the hospital secondary to a syncopal episode versus seizure episode.  #1 Staring spells -with known history of seizure disorder. Due to depression -Appreciate neurology consult. -MRI of the brain without any new infarcts, but patient does have old clusters of infarcts along left frontal convexity. -Outpatient 30 day Holter monitor to rule out underlying atrial fibrillation neck and left and continue aspirin and statin for now. -EEG with no acute seizures  -Neurontin restarted - appreciate neurology consult  #2 chest pain-right-sided chest pain, appreciate cardiology consult. Troponins are negative -No ischemic changes on EKG. Echo shows normal LV  function. -Outpatient stress test and 30 day Holter monitor recommended by cardiology.  #3 depression-no active suicidal ideation. -Psychiatry consult is appreciated. - patient has been emotional due to family deaths  Patient's Prozac discontinued due to prolonged QT. Tolerating zoloft well and also trazodone at bedtime - outpatient f/u at [facility]  #4 hypertension-hold chlorthalidone due to hypokalemia. Hold atenolol due to bradycardia -Added Norvasc at discharge as she tolerated well in the hospital  #5 diabetes mellitus-restart Glucophage at discharge  PT recommended SNF, but patients orthostatic hypotension has improved and she is much stronger. She cannot go to rehab due to observation status per medicare Patient will go home with home health today  DISCHARGE CONDITIONS:   Guarded  CONSULTS OBTAINED:   Treatment Team:  Minna Merritts, MD Alexis Goodell, MD Chauncey Mann, MD  DRUG ALLERGIES:   Allergies  Allergen Reactions  . Nystatin Other (See Comments)    Reaction: unknown   DISCHARGE MEDICATIONS:   Allergies as of 09/20/2017      Reactions   Nystatin Other (See Comments)   Reaction: unknown      Medication List    STOP taking these medications   atenolol-chlorthalidone 100-25 MG tablet Commonly known as:  TENORETIC   FLUoxetine 10 MG capsule Commonly known as:  PROZAC   guaiFENesin-dextromethorphan 100-10 MG/5ML syrup Commonly known as:  ROBITUSSIN DM     TAKE these medications   acyclovir 800 MG tablet Commonly known as:  ZOVIRAX Take 800 mg by mouth daily.   albuterol 108 (90 Base) MCG/ACT inhaler Commonly known as:  PROVENTIL  HFA;VENTOLIN HFA Inhale 2 puffs into the lungs every 4 (four) hours as needed for wheezing or shortness of breath.   amLODipine 5 MG tablet Commonly known as:  NORVASC Take 1 tablet (5 mg total) by mouth daily.   ASPIR-LOW 81 MG EC tablet Generic drug:  aspirin Take 81 mg by mouth daily.   atorvastatin 40 MG  tablet Commonly known as:  LIPITOR Take 40 mg by mouth daily.   fluticasone 110 MCG/ACT inhaler Commonly known as:  FLOVENT HFA Inhale 2 puffs into the lungs 2 (two) times daily.   gabapentin 300 MG capsule Commonly known as:  NEURONTIN Take 900 mg by mouth 3 (three) times daily.   ipratropium 17 MCG/ACT inhaler Commonly known as:  ATROVENT HFA Inhale 2 puffs into the lungs 2 (two) times daily.   ipratropium-albuterol 0.5-2.5 (3) MG/3ML Soln Commonly known as:  DUONEB Take 3 mLs by nebulization every 4 (four) hours as needed (Wheezing or shortness of breath).   metFORMIN 500 MG tablet Commonly known as:  GLUCOPHAGE Take 500 mg by mouth 2 (two) times daily with a meal.   sertraline 25 MG tablet Commonly known as:  ZOLOFT Take 1 tablet (25 mg total) by mouth daily.   SYMBICORT 160-4.5 MCG/ACT inhaler Generic drug:  budesonide-formoterol Inhale 2 puffs into the lungs 2 (two) times daily.   traZODone 50 MG tablet Commonly known as:  DESYREL Take 1 tablet (50 mg total) by mouth at bedtime as needed for sleep.            Durable Medical Equipment        Start     Ordered   09/19/17 1126  For home use only DME Shower stool  Once     09/19/17 1125       DISCHARGE INSTRUCTIONS:   1. PCP f/u in 1-2 weeks 2. Outpatient psychiatry f/u at [facility]  DIET:   Cardiac diet  ACTIVITY:   Activity as tolerated  OXYGEN:   Home Oxygen: No.  Oxygen Delivery: room air  DISCHARGE LOCATION:   home   If you experience worsening of your admission symptoms, develop shortness of breath, life threatening emergency, suicidal or homicidal thoughts you must seek medical attention immediately by calling 911 or calling your MD immediately  if symptoms less severe.  You Must read complete instructions/literature along with all the possible adverse reactions/side effects for all the Medicines you take and that have been prescribed to you. Take any new Medicines after you have  completely understood and accpet all the possible adverse reactions/side effects.   Please note  You were cared for by a hospitalist during your hospital stay. If you have any questions about your discharge medications or the care you received while you were in the hospital after you are discharged, you can call the unit and asked to speak with the hospitalist on call if the hospitalist that took care of you is not available. Once you are discharged, your primary care physician will handle any further medical issues. Please note that NO REFILLS for any discharge medications will be authorized once you are discharged, as it is imperative that you return to your primary care physician (or establish a relationship with a primary care physician if you do not have one) for your aftercare needs so that they can reassess your need for medications and monitor your lab values.    On the day of Discharge:  VITAL SIGNS:   Blood pressure 139/72, pulse (!) 55, temperature  98.2 F (36.8 C), temperature source Oral, resp. rate 18, height 5\' 5"  (1.651 m), weight 100.4 kg (221 lb 4.8 oz), SpO2 94 %.  PHYSICAL EXAMINATION:   GENERAL:  56 y.o.-year-old patient Sitting in the bed with no acute distress. Disheveled appearing EYES: Pupils equal, round, reactive to light and accommodation. No scleral icterus. Extraocular muscles intact.  HEENT: Head atraumatic, normocephalic. Oropharynx and nasopharynx clear.  NECK:  Supple, no jugular venous distention. No thyroid enlargement, no tenderness.  LUNGS: Normal breath sounds bilaterally, no wheezing, rales,rhonchi or crepitation. No use of accessory muscles of respiration. Decreased bibasilar breath sounds CARDIOVASCULAR: S1, S2 normal. No murmurs, rubs, or gallops.  ABDOMEN: Soft, nontender, nondistended. Bowel sounds present. No organomegaly or mass.  EXTREMITIES: No pedal edema, cyanosis, or clubbing.  NEUROLOGIC: Cranial nerves II through XII are intact. Muscle  strength 5/5 in all extremities. Sensation intact. Gait not checked. Speech is clear today.  PSYCHIATRIC: The patient is alert and oriented x 3.  SKIN: No obvious rash, lesion, or ulcer.   DATA REVIEW:   CBC  Recent Labs Lab 09/18/17 0811  WBC 5.5  HGB 12.4  HCT 37.2  PLT 150    Chemistries   Recent Labs Lab 09/17/17 2102  09/19/17 1111  NA 145  < > 140  K 3.1*  < > 3.6  CL 107  < > 104  CO2 28  < > 28  GLUCOSE 113*  < > 131*  BUN 15  < > 14  CREATININE 0.91  < > 0.83  CALCIUM 9.0  < > 8.8*  MG  --   --  2.3  AST 22  --   --   ALT 17  --   --   ALKPHOS 109  --   --   BILITOT 0.4  --   --   < > = values in this interval not displayed.   Microbiology Results  No results found for this or any previous visit.  RADIOLOGY:  No results found.   Management plans discussed with the patient, family and they are in agreement.  CODE STATUS:     Code Status Orders        Start     Ordered   09/18/17 0152  Full code  Continuous     09/18/17 0151    Code Status History    Date Active Date Inactive Code Status Order ID Comments User Context   05/29/2017  3:45 PM 05/30/2017  5:09 PM Full Code [id number]  Hillary Bow, MD ED   12/31/2016  7:44 AM 01/02/2017  3:19 PM Full Code [id number]  Harrie Foreman, MD Inpatient      TOTAL TIME TAKING CARE OF THIS PATIENT: 37 minutes.    Reily Ilic M.D on 09/20/2017 at 1:27 PM  Between 7am to 6pm - Pager - [phone]  After 6pm go to [email or web address] - Proofreader  [facility]  Office  [phone]  CC: Primary care physician; [place]   Note: This dictation was prepared with Dragon dictation along with smaller phrase technology. Any transcriptional errors that result from this process are unintentional.

## 2017-09-20 NOTE — Clinical Social Work Note (Addendum)
CSW received consult that PT has recommended STR. However, the patient is under observation status, and her insurance will not cover STR. CSW will discuss with patient when able.  CSW attempted to visit with patient; however, she had already discharged. CSW is signing off.  Santiago Bumpers, MSW, Latanya Presser [phone]

## 2017-09-23 ENCOUNTER — Telehealth: Payer: Self-pay | Admitting: *Deleted

## 2017-09-23 NOTE — Telephone Encounter (Signed)
-----   Message from Nelva Bush, MD sent at 09/19/2017 10:49 AM EDT ----- Regarding: Outpatient f/u Hi Pam,  This patient was seen by Dr. Rockey Situ and will likely be discharged over the weekend. Can you help make sure that an outpatient lexiscan myoview and 30-day event monitor have been arranged for chest pain and syncope? I'm not sure if the hospitalist will be able to take care of this over the weekend. Ms. Liggett should f/u with Dr. Rockey Situ or an APP shortly after these tests have been completed. Thanks.  Gerald Stabs

## 2017-09-23 NOTE — Telephone Encounter (Signed)
No answer.Voicemail is full. °

## 2017-09-25 NOTE — Telephone Encounter (Signed)
No answer. No voicemail. 

## 2017-09-29 ENCOUNTER — Encounter: Payer: Self-pay | Admitting: *Deleted

## 2017-09-29 NOTE — Telephone Encounter (Signed)
Unable to reach patient. Letter sent for her to contact our office to schedule when possible.

## 2017-09-29 NOTE — Telephone Encounter (Signed)
No answer.Voicemail is full. °

## 2017-10-02 NOTE — Progress Notes (Signed)
[facility]  Patient Status: 09/22/17 SN arrived at patient's home for scheduled visit, patient was sitting on front porch, several family members arrived at the same time as SN. Patient was teary and reported she could not move her leg. She had been sitting on the porch all morning and had finally called her family. Family was on edge, and reported to SN that the patient should not be alone and should be in a rehab facility until she is independent with care. They report there is no one there to care for her. She is currently living with her niece. SN assessed patient and she has 10/10 pain in her right leg and is unable to move it. She refused emergency care. Patients sister called niece while SN was there and reported to SN that she was on her way to get the patient and take her back to [place] to her daughter's house and she would handle the situation. The sister reported they didnt need [place] services. SN spoke to niece and other family members that were at the home regarding sister's plan. They were all in agreement that this would be best for all involved. SN will call MD to make them aware of nonadmission. Notified [place], [place] Marshell Garfinkel, RNCM.      Florene Glen 10/02/2017, 9:31 AM

## 2022-10-06 ENCOUNTER — Emergency Department: Payer: Medicare Other

## 2022-10-06 ENCOUNTER — Inpatient Hospital Stay
Admission: EM | Admit: 2022-10-06 | Discharge: 2022-10-11 | DRG: 286 | Disposition: A | Discharge status: Home / Self Care | Payer: Medicare Other | Attending: Student | Admitting: Student

## 2022-10-06 DIAGNOSIS — Z8673 Personal history of transient ischemic attack (TIA), and cerebral infarction without residual deficits: Secondary | ICD-10-CM

## 2022-10-06 DIAGNOSIS — A6009 Herpesviral infection of other urogenital tract: Secondary | ICD-10-CM | POA: Diagnosis present

## 2022-10-06 DIAGNOSIS — M79631 Pain in right forearm: Secondary | ICD-10-CM | POA: Diagnosis not present

## 2022-10-06 DIAGNOSIS — Z888 Allergy status to other drugs, medicaments and biological substances status: Secondary | ICD-10-CM

## 2022-10-06 DIAGNOSIS — Z720 Tobacco use: Secondary | ICD-10-CM | POA: Diagnosis present

## 2022-10-06 DIAGNOSIS — I340 Nonrheumatic mitral (valve) insufficiency: Secondary | ICD-10-CM | POA: Diagnosis present

## 2022-10-06 DIAGNOSIS — E1142 Type 2 diabetes mellitus with diabetic polyneuropathy: Secondary | ICD-10-CM | POA: Diagnosis present

## 2022-10-06 DIAGNOSIS — Z79899 Other long term (current) drug therapy: Secondary | ICD-10-CM | POA: Diagnosis not present

## 2022-10-06 DIAGNOSIS — D649 Anemia, unspecified: Secondary | ICD-10-CM | POA: Diagnosis present

## 2022-10-06 DIAGNOSIS — N179 Acute kidney failure, unspecified: Secondary | ICD-10-CM | POA: Diagnosis present

## 2022-10-06 DIAGNOSIS — I10 Essential (primary) hypertension: Secondary | ICD-10-CM | POA: Diagnosis present

## 2022-10-06 DIAGNOSIS — E118 Type 2 diabetes mellitus with unspecified complications: Secondary | ICD-10-CM | POA: Diagnosis present

## 2022-10-06 DIAGNOSIS — Z20822 Contact with and (suspected) exposure to covid-19: Secondary | ICD-10-CM | POA: Diagnosis present

## 2022-10-06 DIAGNOSIS — E785 Hyperlipidemia, unspecified: Secondary | ICD-10-CM | POA: Diagnosis present

## 2022-10-06 DIAGNOSIS — I110 Hypertensive heart disease with heart failure: Principal | ICD-10-CM | POA: Diagnosis present

## 2022-10-06 DIAGNOSIS — F17210 Nicotine dependence, cigarettes, uncomplicated: Secondary | ICD-10-CM | POA: Diagnosis present

## 2022-10-06 DIAGNOSIS — I509 Heart failure, unspecified: Principal | ICD-10-CM

## 2022-10-06 DIAGNOSIS — Z833 Family history of diabetes mellitus: Secondary | ICD-10-CM | POA: Diagnosis not present

## 2022-10-06 DIAGNOSIS — G40909 Epilepsy, unspecified, not intractable, without status epilepticus: Secondary | ICD-10-CM | POA: Diagnosis present

## 2022-10-06 DIAGNOSIS — R0602 Shortness of breath: Secondary | ICD-10-CM

## 2022-10-06 DIAGNOSIS — J449 Chronic obstructive pulmonary disease, unspecified: Secondary | ICD-10-CM | POA: Diagnosis not present

## 2022-10-06 DIAGNOSIS — I5043 Acute on chronic combined systolic (congestive) and diastolic (congestive) heart failure: Secondary | ICD-10-CM | POA: Diagnosis present

## 2022-10-06 DIAGNOSIS — R000 Tachycardia, unspecified: Secondary | ICD-10-CM | POA: Diagnosis present

## 2022-10-06 DIAGNOSIS — I5021 Acute systolic (congestive) heart failure: Secondary | ICD-10-CM | POA: Diagnosis present

## 2022-10-06 DIAGNOSIS — F32A Depression, unspecified: Secondary | ICD-10-CM | POA: Diagnosis present

## 2022-10-06 DIAGNOSIS — I5033 Acute on chronic diastolic (congestive) heart failure: Secondary | ICD-10-CM | POA: Diagnosis present

## 2022-10-06 DIAGNOSIS — I420 Dilated cardiomyopathy: Secondary | ICD-10-CM | POA: Diagnosis present

## 2022-10-06 DIAGNOSIS — I5041 Acute combined systolic (congestive) and diastolic (congestive) heart failure: Secondary | ICD-10-CM | POA: Diagnosis present

## 2022-10-06 DIAGNOSIS — F419 Anxiety disorder, unspecified: Secondary | ICD-10-CM | POA: Diagnosis present

## 2022-10-06 DIAGNOSIS — E669 Obesity, unspecified: Secondary | ICD-10-CM | POA: Diagnosis present

## 2022-10-06 DIAGNOSIS — Z6838 Body mass index (BMI) 38.0-38.9, adult: Secondary | ICD-10-CM

## 2022-10-06 LAB — HEPATIC FUNCTION PANEL
ALT: 22 U/L (ref 0–44)
AST: 27 U/L (ref 15–41)
Albumin: 3.5 g/dL (ref 3.5–5.0)
Alkaline Phosphatase: 110 U/L (ref 38–126)
Bilirubin, Direct: 0.1 mg/dL (ref 0.0–0.2)
Indirect Bilirubin: 0.7 mg/dL (ref 0.3–0.9)
Total Bilirubin: 0.8 mg/dL (ref 0.3–1.2)
Total Protein: 7.4 g/dL (ref 6.5–8.1)

## 2022-10-06 LAB — BASIC METABOLIC PANEL
Anion gap: 10 (ref 5–15)
BUN: 16 mg/dL (ref 8–23)
CO2: 23 mmol/L (ref 22–32)
Calcium: 8.9 mg/dL (ref 8.9–10.3)
Chloride: 107 mmol/L (ref 98–111)
Creatinine, Ser: 1.33 mg/dL — ABNORMAL HIGH (ref 0.44–1.00)
GFR, Estimated: 46 mL/min — ABNORMAL LOW (ref >60)
Glucose, Bld: 117 mg/dL — ABNORMAL HIGH (ref 70–99)
Potassium: 3.6 mmol/L (ref 3.5–5.1)
Sodium: 140 mmol/L (ref 135–145)

## 2022-10-06 LAB — CBC
HCT: 32.6 % — ABNORMAL LOW (ref 36.0–46.0)
Hemoglobin: 9.6 g/dL — ABNORMAL LOW (ref 12.0–15.0)
MCH: 27.1 pg (ref 26.0–34.0)
MCHC: 29.4 g/dL — ABNORMAL LOW (ref 30.0–36.0)
MCV: 92.1 fL (ref 80.0–100.0)
Platelets: 176 10*3/uL (ref 150–400)
RBC: 3.54 MIL/uL — ABNORMAL LOW (ref 3.87–5.11)
RDW: 17 % — ABNORMAL HIGH (ref 11.5–15.5)
WBC: 7.4 10*3/uL (ref 4.0–10.5)
nRBC: 0 % (ref 0.0–0.2)

## 2022-10-06 LAB — BRAIN NATRIURETIC PEPTIDE: B Natriuretic Peptide: 719.8 pg/mL — ABNORMAL HIGH (ref 0.0–100.0)

## 2022-10-06 LAB — SARS CORONAVIRUS 2 BY RT PCR: SARS Coronavirus 2 by RT PCR: NEGATIVE

## 2022-10-06 LAB — TROPONIN I (HIGH SENSITIVITY): Troponin I (High Sensitivity): 11 ng/L (ref <18)

## 2022-10-06 MED ORDER — ACETAMINOPHEN 325 MG PO TABS: 650.0000 mg | ORAL_TABLET | Freq: Four times a day (QID) | ORAL | Status: DC | PRN

## 2022-10-06 MED ORDER — ONDANSETRON HCL 4 MG/2ML IJ SOLN: 4.0000 mg | Freq: Four times a day (QID) | INTRAMUSCULAR | Status: DC | PRN

## 2022-10-06 MED ORDER — ENOXAPARIN SODIUM 40 MG/0.4ML IJ SOSY
40.0000 mg | PREFILLED_SYRINGE | INTRAMUSCULAR | Status: DC
  Administered 2022-10-06 – 2022-10-10 (×5): 40 mg via SUBCUTANEOUS
  Filled 2022-10-06 (×5): qty 0.4

## 2022-10-06 MED ORDER — ALBUTEROL SULFATE HFA 108 (90 BASE) MCG/ACT IN AERS: 2.0000 | INHALATION_SPRAY | RESPIRATORY_TRACT | Status: DC | PRN

## 2022-10-06 MED ORDER — ATORVASTATIN CALCIUM 20 MG PO TABS
40.0000 mg | ORAL_TABLET | Freq: Every day | ORAL | Status: DC
  Administered 2022-10-07 – 2022-10-10 (×4): 40 mg via ORAL
  Filled 2022-10-06 (×4): qty 2

## 2022-10-06 MED ORDER — FUROSEMIDE 10 MG/ML IJ SOLN
40.0000 mg | Freq: Two times a day (BID) | INTRAMUSCULAR | Status: DC
  Administered 2022-10-07 (×2): 40 mg via INTRAVENOUS
  Filled 2022-10-06 (×2): qty 4

## 2022-10-06 MED ORDER — SERTRALINE HCL 50 MG PO TABS: 25.0000 mg | ORAL_TABLET | Freq: Every day | ORAL | Status: DC

## 2022-10-06 MED ORDER — TRAZODONE HCL 50 MG PO TABS: 50.0000 mg | ORAL_TABLET | Freq: Every evening | ORAL | Status: DC | PRN

## 2022-10-06 MED ORDER — ACETAMINOPHEN 650 MG RE SUPP: 650.0000 mg | Freq: Four times a day (QID) | RECTAL | Status: DC | PRN

## 2022-10-06 MED ORDER — IPRATROPIUM-ALBUTEROL 0.5-2.5 (3) MG/3ML IN SOLN
3.0000 mL | RESPIRATORY_TRACT | Status: DC | PRN
  Filled 2022-10-06: qty 3

## 2022-10-06 MED ORDER — ASPIRIN 81 MG PO TBEC
81.0000 mg | DELAYED_RELEASE_TABLET | Freq: Every day | ORAL | Status: DC
  Administered 2022-10-07 – 2022-10-11 (×5): 81 mg via ORAL
  Filled 2022-10-06 (×5): qty 1

## 2022-10-06 MED ORDER — AMLODIPINE BESYLATE 5 MG PO TABS: 5.0000 mg | ORAL_TABLET | Freq: Every day | ORAL | Status: DC

## 2022-10-06 MED ORDER — ACYCLOVIR 800 MG PO TABS
800.0000 mg | ORAL_TABLET | Freq: Every day | ORAL | Status: DC
  Filled 2022-10-06: qty 1

## 2022-10-06 MED ORDER — ALBUTEROL SULFATE (2.5 MG/3ML) 0.083% IN NEBU
3.0000 mL | INHALATION_SOLUTION | RESPIRATORY_TRACT | Status: DC | PRN
  Administered 2022-10-06: 3 mL via RESPIRATORY_TRACT
  Filled 2022-10-06: qty 3

## 2022-10-06 MED ORDER — FLUTICASONE PROPIONATE HFA 110 MCG/ACT IN AERO: 2.0000 | INHALATION_SPRAY | Freq: Two times a day (BID) | RESPIRATORY_TRACT | Status: DC

## 2022-10-06 MED ORDER — FUROSEMIDE 10 MG/ML IJ SOLN
40.0000 mg | Freq: Once | INTRAMUSCULAR | Status: AC
  Administered 2022-10-06: 40 mg via INTRAVENOUS
  Filled 2022-10-06: qty 4

## 2022-10-06 MED ORDER — ONDANSETRON HCL 4 MG PO TABS: 4.0000 mg | ORAL_TABLET | Freq: Four times a day (QID) | ORAL | Status: DC | PRN

## 2022-10-06 MED ORDER — IPRATROPIUM BROMIDE 0.02 % IN SOLN
0.5000 mg | Freq: Two times a day (BID) | RESPIRATORY_TRACT | Status: DC
  Administered 2022-10-06 – 2022-10-07 (×2): 0.5 mg via RESPIRATORY_TRACT
  Filled 2022-10-06 (×2): qty 2.5

## 2022-10-06 MED ORDER — MAGNESIUM HYDROXIDE 400 MG/5ML PO SUSP: 30.0000 mL | Freq: Every day | ORAL | Status: DC | PRN

## 2022-10-06 MED ORDER — GABAPENTIN 300 MG PO CAPS
300.0000 mg | ORAL_CAPSULE | Freq: Three times a day (TID) | ORAL | Status: DC
  Administered 2022-10-06 – 2022-10-11 (×13): 300 mg via ORAL
  Filled 2022-10-06 (×13): qty 1

## 2022-10-06 NOTE — Assessment & Plan Note (Signed)
-   We will continue her inhalers while holding off long-acting beta agonist in the setting of acute CHF.

## 2022-10-06 NOTE — Assessment & Plan Note (Addendum)
Echocardiogram done today with new diagnosis of reduced EF and grade 2 diastolic dysfunction. Prior echocardiogram with normal EF and diastolic dysfunction. Cardiology was consulted. -Continue with IV Lasix -Daily weight and BMP -Strict intake and output

## 2022-10-06 NOTE — H&P (Signed)
[place]   PATIENT NAME: Jennifer Weiss    MR#:  [id number]  DATE OF BIRTH:  Apr 25, 1961  DATE OF ADMISSION:  10/06/2022  PRIMARY CARE PHYSICIAN: Hill, [place]   Patient is coming from: Home  REQUESTING/REFERRING PHYSICIAN: Rada Hay, MD  CHIEF COMPLAINT:   Chief Complaint  Patient presents with   Shortness of Breath    HISTORY OF PRESENT ILLNESS:  Makahla Kiser is a 61 y.o. African-American female with medical history significant for asthma type 2 diabetes mellitus, hypertension, seizure disorder, carotid artery disease, who presented to the ER with acute onset of worsening dyspnea over the last 2 to 3 months and got significantly worse today.  She has been having mild cough productive of yellowish sputum.  She admits to wheezing as well as tactile fever and chills without measured temperature, no chest pain or palpitations.  She admits to dyspnea on exertion and paroxysmal nocturnal dyspnea as well as lower extremity edema.  No nausea or vomiting or abdominal pain.  No dysuria, oliguria or hematuria or flank pain.  ED Course: When she came to the ER, BP was 151/90 with heart rate of 103 and pulse oximetry 90% on room air and later 95%.  Labs revealed anemia with hemoglobin 11.6 hematocrit 32.6 compared to 12.4 and 37.2.  CMP was remarkable for creatinine 1.33 compared to 0.83 then BNP was 719.8  EKG as reviewed by me : Showed sinus tachycardia with rate of 101 with Q waves inferiorly. Imaging: Portable chest ray showed pulmonary edema with trace left pleural effusion superimposed infection is not excluded.  EKG showed a sinus tachycardia with a rate of 101 with Q waves inferiorly.  The patient was given 40 mg of IV Lasix.  She will be admitted to a cardiac telemetry bed for further evaluation and management. PAST MEDICAL HISTORY:   Past Medical History:  Diagnosis Date   Arthritis    Asthma    Cancer ([place])    ovarian   Carotid arterial disease  ([place])    a. 09/2017 Carotid U/S: 50-69% @ R carotid bifurcation. LICA ok.   Diabetes mellitus without complication ([place])    Hypertension    Remote history of stroke    Seizures ([place])    Tobacco abuse     PAST SURGICAL HISTORY:   Past Surgical History:  Procedure Laterality Date   none      SOCIAL HISTORY:   Social History   Tobacco Use   Smoking status: Every Day    Packs/day: 0.50    Types: Cigarettes   Smokeless tobacco: Never  Substance Use Topics   Alcohol use: No    FAMILY HISTORY:   Family History  Problem Relation Age of Onset   Diabetes Mellitus II Mother     DRUG ALLERGIES:   Allergies  Allergen Reactions   Metronidazole Hives   Nystatin Other (See Comments)    Reaction: unknown   Lisinopril Other (See Comments)    cough cough     REVIEW OF SYSTEMS:   ROS As per history of present illness. All pertinent systems were reviewed above. Constitutional, HEENT, cardiovascular, respiratory, GI, GU, musculoskeletal, neuro, psychiatric, endocrine, integumentary and hematologic systems were reviewed and are otherwise negative/unremarkable except for positive findings mentioned above in the HPI.   MEDICATIONS AT HOME:   Prior to Admission medications   Medication Sig Start Date End Date Taking? Authorizing Provider  Cholecalciferol (VITAMIN D3) 25 MCG (1000 UT) CAPS  Take by mouth. 04/21/22 04/21/23 Yes [provider]  pyridOXINE (VITAMIN B6) 25 MG tablet Take 1 tablet by mouth daily. 03/17/22 03/17/23 Yes [provider]  acyclovir (ZOVIRAX) 800 MG tablet Take 800 mg by mouth daily.    [provider]  albuterol (PROVENTIL HFA;VENTOLIN HFA) 108 (90 Base) MCG/ACT inhaler Inhale 2 puffs into the lungs every 4 (four) hours as needed for wheezing or shortness of breath.    [provider]  amitriptyline (ELAVIL) 50 MG tablet Take 50 mg by mouth at bedtime. 06/16/22   [provider]  amLODipine (NORVASC) 5 MG tablet Take 1  tablet (5 mg total) by mouth daily. 09/20/17   Gladstone Lighter, MD  ASPIR-LOW 81 MG EC tablet Take 81 mg by mouth daily.    [provider]  atorvastatin (LIPITOR) 80 MG tablet Take 80 mg by mouth daily. 09/23/22   [provider]  budesonide-formoterol (SYMBICORT) 160-4.5 MCG/ACT inhaler Inhale 2 puffs into the lungs 2 (two) times daily.    [provider]  FLUoxetine (PROZAC) 20 MG capsule Take 20 mg by mouth daily. 09/23/22   [provider]  fluticasone (FLOVENT HFA) 110 MCG/ACT inhaler Inhale 2 puffs into the lungs 2 (two) times daily.    [provider]  gabapentin (NEURONTIN) 300 MG capsule Take 900 mg by mouth 3 (three) times daily.    [provider]  ipratropium (ATROVENT HFA) 17 MCG/ACT inhaler Inhale 2 puffs into the lungs 2 (two) times daily.    [provider]  ipratropium-albuterol (DUONEB) 0.5-2.5 (3) MG/3ML SOLN Take 3 mLs by nebulization every 4 (four) hours as needed (Wheezing or shortness of breath). 05/30/17   Hillary Bow, MD  losartan (COZAAR) 100 MG tablet Take 100 mg by mouth daily. 07/16/22   [provider]  metFORMIN (GLUCOPHAGE) 500 MG tablet Take 500 mg by mouth 2 (two) times daily with a meal.    [provider]  sertraline (ZOLOFT) 25 MG tablet Take 1 tablet (25 mg total) by mouth daily. 09/20/17   Gladstone Lighter, MD  traZODone (DESYREL) 50 MG tablet Take 1 tablet (50 mg total) by mouth at bedtime as needed for sleep. 09/20/17   Gladstone Lighter, MD  varenicline (CHANTIX) 1 MG tablet Take by mouth. 07/17/22   [provider]      VITAL SIGNS:  Blood pressure (!) 157/96, pulse 93, temperature 98.4 F (36.9 C), temperature source Oral, resp. rate 19, weight 94.8 kg, SpO2 92 %.  PHYSICAL EXAMINATION:  Physical Exam  GENERAL:  61 y.o.-year-old African-American female patient lying in the bed with mild respiratory distress with conversational dyspnea EYES: Pupils equal,  round, reactive to light and accommodation. No scleral icterus. Extraocular muscles intact.  HEENT: Head atraumatic, normocephalic. Oropharynx and nasopharynx clear.  NECK:  Supple, no jugular venous distention. No thyroid enlargement, no tenderness.  LUNGS: Diminished bibasilar breath sounds with bibasal rales. No use of accessory muscles of respiration.  CARDIOVASCULAR: Regular rate and rhythm, S1, S2 normal. No murmurs, rubs, or gallops.  ABDOMEN: Soft, nondistended, nontender. Bowel sounds present. No organomegaly or mass.  EXTREMITIES: 1+-2+ bilateral lower extremity pitting edema with no cyanosis, or clubbing.  NEUROLOGIC: Cranial nerves II through XII are intact. Muscle strength 5/5 in all extremities. Sensation intact. Gait not checked.  PSYCHIATRIC: The patient is alert and oriented x 3.  Normal affect and good eye contact. SKIN: No obvious rash, lesion, or ulcer.   LABORATORY PANEL:   CBC Recent Labs  Lab 10/06/22 1843  WBC 7.4  HGB 9.6*  HCT 32.6*  PLT 176   ------------------------------------------------------------------------------------------------------------------  Chemistries  Recent Labs  Lab 10/06/22 1847 10/06/22 1927  NA  --  140  K  --  3.6  CL  --  107  CO2  --  23  GLUCOSE  --  117*  BUN  --  16  CREATININE  --  1.33*  CALCIUM  --  8.9  AST 27  --   ALT 22  --   ALKPHOS 110  --   BILITOT 0.8  --    ------------------------------------------------------------------------------------------------------------------  Cardiac Enzymes No results for input(s): "TROPONINI" in the last 168 hours. ------------------------------------------------------------------------------------------------------------------  RADIOLOGY:  DG Chest 1 View  Result Date: 10/06/2022 CLINICAL DATA:  141880 SOB (shortness of breath) [id number] EXAM: CHEST  1 VIEW COMPARISON:  Chest x-ray 05/29/2017, CT chest 09/17/2017 FINDINGS: The heart and mediastinal contours are  unchanged. No focal consolidation. Increased interstitial markings. Trace left pleural effusion. No right pleural effusion. No pneumothorax. No acute osseous abnormality. IMPRESSION: Pulmonary edema with trace left pleural effusion. Superimposed infection not excluded. Electronically Signed   By: Iven Finn M.D.   On: 10/06/2022 19:31      IMPRESSION AND PLAN:  Assessment and Plan: * Acute on chronic diastolic CHF (congestive heart failure) ([place])  -The patient will be admitted to a cardiac telemetry bed. - We will continue diuresis with IV Lasix. - Will follow serial troponins.   - We will obtain a 2D echo as her last echo was in 2018. - Cardiology consult will be obtained. - I notified Dr. Radford Pax about the patient  Essential hypertension - We will continue her antihypertensives.  Type 2 diabetes mellitus with peripheral neuropathy (HCC) - The patient will be placed on supplement coverage with NovoLog. - We will continue her Neurontin. - We will hold off her metformin.  Anxiety and depression - We will continue her SSRI.  Dyslipidemia - We will continue her statin therapy.  Chronic obstructive pulmonary disease (COPD) (HCC) - We will continue her inhalers while holding off long-acting beta agonist in the setting of acute CHF.  Tobacco abuse -She will be counseled for smoking cessation.   DVT prophylaxis: Lovenox.  Advanced Care Planning:  Code Status: full code.    Family Communication:  The plan of care was discussed in details with the patient (and family). I answered all questions. The patient agreed to proceed with the above mentioned plan. Further management will depend upon hospital course. Disposition Plan: Back to previous home environment Consults called: Cardiology All the records are reviewed and case discussed with ED provider.  Status is: Inpatient  At the time of the admission, it appears that the appropriate admission status for this patient is  inpatient.  This is judged to be reasonable and necessary in order to provide the required intensity of service to ensure the patient's safety given the presenting symptoms, physical exam findings and initial radiographic and laboratory data in the context of comorbid conditions.  The patient requires inpatient status due to high intensity of service, high risk of further deterioration and high frequency of surveillance required.  I certify that at the time of admission, it is my clinical judgment that the patient will require inpatient hospital care extending more than 2 midnights.                            Dispo: The patient is  from: Home              Anticipated d/c is to: Home              Patient currently is not medically stable to d/c.              Difficult to place patient: No  Christel Mormon M.D on 10/07/2022 at 12:51 AM  [facility]   From 7 PM-7 AM, contact night-coverage [email or web address]  CC: Primary care physician; Hill, [place]

## 2022-10-06 NOTE — ED Provider Notes (Addendum)
[facility] Provider Note    Event Date/Time   First MD Initiated Contact with Patient 10/06/22 1911     (approximate)   History   Shortness of Breath   HPI  Jennifer Weiss is a 61 y.o. female with past medical history of diabetes, asthma/COPD who presents with shortness of breath.  Patient tells me she has been feeling short of breath for 2 to 3 months but is scared of hospitals so did not want to come.  She was feeling worse today and her family ember called 911 because they were concerned.  She has had a mild cough occasionally with yellow sputum.  Denies chest pain but does feel somewhat tight when she is feeling short of breath.  She endorses both dyspnea on exertion and with laying flat has cough at night and feels short of breath at night.  Does not dorsum lower extremity swelling.  She is not aware of any history of cardiac disease or heart failure.  She has been using her rescue inhaler which helps somewhat.  Patient has echo from 2018 that showed normal EF with mild LVH    Past Medical History:  Diagnosis Date   Arthritis    Asthma    Cancer ([place])    ovarian   Carotid arterial disease ([place])    a. 09/2017 Carotid U/S: 50-69% @ R carotid bifurcation. LICA ok.   Diabetes mellitus without complication ([place])    Hypertension    Remote history of stroke    Seizures ([place])    Tobacco abuse     Patient Active Problem List   Diagnosis Date Noted   Moderate episode of recurrent major depressive disorder ([place])    Chest pain 09/17/2017   Loss of consciousness ([place]) 09/17/2017   Seizures ([place]) 09/17/2017   Diabetes ([place]) 09/17/2017   HTN (hypertension) 09/17/2017   COPD exacerbation ([place]) 05/29/2017   Hypoxia 12/31/2016     Physical Exam  Triage Vital Signs: ED Triage Vitals  Enc Vitals Group     BP 10/06/22 1839 (!) 151/90     Pulse Rate 10/06/22 1839 (!) 103     Resp 10/06/22 1839 20     Temp 10/06/22 1839 98.1 F (36.7 C)     Temp Source  10/06/22 1839 Oral     SpO2 10/06/22 1839 90 %     Weight 10/06/22 1840 209 lb (94.8 kg)     Height --      Head Circumference --      Peak Flow --      Pain Score 10/06/22 1840 0     Pain Loc --      Pain Edu? --      Excl. in [place]? --     Most recent vital signs: Vitals:   10/06/22 2000 10/06/22 2030  BP: (!) 164/98 (!) 162/91  Pulse: 97 99  Resp: (!) 22 (!) 25  Temp:    SpO2: 95% 93%     General: Awake, tearful appears anxious CV:  Good peripheral perfusion.  Trace pitting edema bilateral lower extremities Resp:  Normal effort.  Patient is tachypneic but speaking in full sentences, scattered expiratory wheeze Abd:  No distention.  Neuro:             Awake, Alert, Oriented x 3  Other:     ED Results / Procedures / Treatments  Labs (all labs ordered are listed, but only abnormal results are displayed) Labs Reviewed  CBC -  Abnormal; Notable for the following components:      Result Value   RBC 3.54 (*)    Hemoglobin 9.6 (*)    HCT 32.6 (*)    MCHC 29.4 (*)    RDW 17.0 (*)    All other components within normal limits  BASIC METABOLIC PANEL - Abnormal; Notable for the following components:   Glucose, Bld 117 (*)    Creatinine, Ser 1.33 (*)    GFR, Estimated 46 (*)    All other components within normal limits  BRAIN NATRIURETIC PEPTIDE - Abnormal; Notable for the following components:   B Natriuretic Peptide 719.8 (*)    All other components within normal limits  SARS CORONAVIRUS 2 BY RT PCR  HEPATIC FUNCTION PANEL  TROPONIN I (HIGH SENSITIVITY)     EKG  EKG interpretation performed by myself: NSR, nml axis, nml intervals, no acute ischemic changes    RADIOLOGY I reviewed and interpreted the x-ray which shows pulmonary edema    PROCEDURES:  Critical Care performed: No  Procedures  The patient is on the cardiac monitor to evaluate for evidence of arrhythmia and/or significant heart rate changes.   MEDICATIONS ORDERED IN ED: Medications   albuterol (PROVENTIL) (2.5 MG/3ML) 0.083% nebulizer solution 3 mL (3 mLs Nebulization Given 10/06/22 1925)  furosemide (LASIX) injection 40 mg (40 mg Intravenous Given 10/06/22 2046)     IMPRESSION / MDM / ASSESSMENT AND PLAN / ED COURSE  I reviewed the triage vital signs and the nursing notes.                              Patient's presentation is most consistent with acute presentation with potential threat to life or bodily function.  Differential diagnosis includes, but is not limited to, asthma exacerbation, COPD exacerbation, new CHF, pneumonia, pulmonary hypertension, pulmonary embolism    Patient is a 61 year old female who presents with 2 to 3 months of progressive shortness of breath.  She is having dyspnea on exertion as well as at rest and difficulty laying flat.  Think she may have some leg swelling.  No significant chest pain.  Patient looks uncomfortable on my evaluation she is tachypneic she is on a DuoNeb satting 100%.  She also mildly hypertensive heart rate in the 90s.  She has some wheezing and overall decreased air movement also has pitting edema.  Chest x-ray looks volume overloaded to me.  EKG is nonischemic.  BNP elevated in the 700s Labs otherwise notable for anemia and mild AKI creatinine 1.3.  Presentation is consistent with new onset CHF.  On reassessment she is still tachypneic satting 93% on room air.  Given this is new I think that she is best served to be admitted.  I have given her a dose of IV Lasix.  Will discuss with the hospitalist for admission.   FINAL CLINICAL IMPRESSION(S) / ED DIAGNOSES   Final diagnoses:  Heart failure, unspecified HF chronicity, unspecified heart failure type ([place])     Rx / DC Orders   ED Discharge Orders     None        Note:  This document was prepared using Dragon voice recognition software and may include unintentional dictation errors.   Rada Hay, MD 10/06/22 7062    Rada Hay, MD 10/06/22  [phone]

## 2022-10-06 NOTE — Assessment & Plan Note (Addendum)
CBG elevated.  A1c pending -Add Semglee 5 units twice daily -Continue with SSI

## 2022-10-06 NOTE — Assessment & Plan Note (Signed)
-   We will continue her SSRI.

## 2022-10-06 NOTE — ED Triage Notes (Signed)
Pt sts that her granddaughter called 45 today as pt is having difficulty breathing. Pt sts that she has been SOB for the last two months but does not like hospitals so she did not want to come.

## 2022-10-06 NOTE — Assessment & Plan Note (Signed)
-   We will continue her statin therapy. 

## 2022-10-06 NOTE — Assessment & Plan Note (Addendum)
Blood pressure within goal -Continue home amlodipine. -Keep holding Cozaar for AKI

## 2022-10-06 NOTE — ED Triage Notes (Signed)
Pt from home via ems with reports of SOB x 2 weeks with productive cough. Pt was 86% on RA. Given 2 albuterol nebs and '125mg'$  solumedrol PTA.   96% on 2L [place] 155/66 122 CBG

## 2022-10-07 ENCOUNTER — Encounter: Payer: Self-pay | Admitting: Family Medicine

## 2022-10-07 ENCOUNTER — Inpatient Hospital Stay
Admit: 2022-10-07 | Discharge: 2022-10-07 | Disposition: A | Discharge status: Home / Self Care | Payer: Medicare Other | Attending: Family Medicine | Admitting: Family Medicine

## 2022-10-07 DIAGNOSIS — I5021 Acute systolic (congestive) heart failure: Secondary | ICD-10-CM

## 2022-10-07 DIAGNOSIS — Z720 Tobacco use: Secondary | ICD-10-CM

## 2022-10-07 DIAGNOSIS — I10 Essential (primary) hypertension: Secondary | ICD-10-CM | POA: Diagnosis not present

## 2022-10-07 DIAGNOSIS — E669 Obesity, unspecified: Secondary | ICD-10-CM | POA: Diagnosis present

## 2022-10-07 LAB — HEMOGLOBIN A1C
Hgb A1c MFr Bld: 6.2 % — ABNORMAL HIGH (ref 4.8–5.6)
Mean Plasma Glucose: 131.24 mg/dL

## 2022-10-07 LAB — CBC
HCT: 34.5 % — ABNORMAL LOW (ref 36.0–46.0)
Hemoglobin: 10.5 g/dL — ABNORMAL LOW (ref 12.0–15.0)
MCH: 27.7 pg (ref 26.0–34.0)
MCHC: 30.4 g/dL (ref 30.0–36.0)
MCV: 91 fL (ref 80.0–100.0)
Platelets: 194 10*3/uL (ref 150–400)
RBC: 3.79 MIL/uL — ABNORMAL LOW (ref 3.87–5.11)
RDW: 17.2 % — ABNORMAL HIGH (ref 11.5–15.5)
WBC: 6.2 10*3/uL (ref 4.0–10.5)
nRBC: 0 % (ref 0.0–0.2)

## 2022-10-07 LAB — BASIC METABOLIC PANEL
Anion gap: 10 (ref 5–15)
BUN: 16 mg/dL (ref 8–23)
CO2: 22 mmol/L (ref 22–32)
Calcium: 8.8 mg/dL — ABNORMAL LOW (ref 8.9–10.3)
Chloride: 106 mmol/L (ref 98–111)
Creatinine, Ser: 1.27 mg/dL — ABNORMAL HIGH (ref 0.44–1.00)
GFR, Estimated: 48 mL/min — ABNORMAL LOW (ref >60)
Glucose, Bld: 265 mg/dL — ABNORMAL HIGH (ref 70–99)
Potassium: 4 mmol/L (ref 3.5–5.1)
Sodium: 138 mmol/L (ref 135–145)

## 2022-10-07 LAB — MAGNESIUM: Magnesium: 1.9 mg/dL (ref 1.7–2.4)

## 2022-10-07 LAB — ECHOCARDIOGRAM COMPLETE
Calc EF: 23.4 %
Height: 65 in
Single Plane A2C EF: 22.6 %
Single Plane A4C EF: 27.6 %
Weight: 3680 oz

## 2022-10-07 LAB — HIV ANTIBODY (ROUTINE TESTING W REFLEX): HIV Screen 4th Generation wRfx: NONREACTIVE

## 2022-10-07 LAB — CBG MONITORING, ED
Glucose-Capillary: 251 mg/dL — ABNORMAL HIGH (ref 70–99)
Glucose-Capillary: 172 mg/dL — ABNORMAL HIGH (ref 70–99)
Glucose-Capillary: 101 mg/dL — ABNORMAL HIGH (ref 70–99)

## 2022-10-07 LAB — TSH: TSH: 0.433 u[IU]/mL (ref 0.350–4.500)

## 2022-10-07 LAB — GLUCOSE, CAPILLARY: Glucose-Capillary: 132 mg/dL — ABNORMAL HIGH (ref 70–99)

## 2022-10-07 MED ORDER — IPRATROPIUM BROMIDE 0.02 % IN SOLN
0.5000 mg | Freq: Two times a day (BID) | RESPIRATORY_TRACT | Status: DC
  Administered 2022-10-07 – 2022-10-11 (×8): 0.5 mg via RESPIRATORY_TRACT
  Filled 2022-10-07 (×9): qty 2.5

## 2022-10-07 MED ORDER — FUROSEMIDE 10 MG/ML IJ SOLN: 40.0000 mg | Freq: Two times a day (BID) | INTRAMUSCULAR | Status: DC

## 2022-10-07 MED ORDER — INSULIN ASPART 100 UNIT/ML IJ SOLN
0.0000 [IU] | Freq: Three times a day (TID) | INTRAMUSCULAR | Status: DC
  Administered 2022-10-07: 1 [IU] via SUBCUTANEOUS
  Administered 2022-10-07 – 2022-10-10 (×3): 2 [IU] via SUBCUTANEOUS
  Filled 2022-10-07 (×4): qty 1

## 2022-10-07 MED ORDER — LOSARTAN POTASSIUM 25 MG PO TABS
12.5000 mg | ORAL_TABLET | Freq: Every day | ORAL | Status: DC
  Administered 2022-10-07: 12.5 mg via ORAL
  Filled 2022-10-07: qty 1
  Filled 2022-10-07: qty 0.5

## 2022-10-07 MED ORDER — INSULIN ASPART 100 UNIT/ML IJ SOLN
0.0000 [IU] | Freq: Every day | INTRAMUSCULAR | Status: DC
  Administered 2022-10-07: 3 [IU] via SUBCUTANEOUS
  Filled 2022-10-07: qty 1

## 2022-10-07 MED ORDER — LOSARTAN POTASSIUM 25 MG PO TABS
25.0000 mg | ORAL_TABLET | Freq: Every day | ORAL | Status: DC
  Administered 2022-10-08 – 2022-10-11 (×4): 25 mg via ORAL
  Filled 2022-10-07 (×4): qty 1

## 2022-10-07 MED ORDER — INSULIN GLARGINE-YFGN 100 UNIT/ML ~~LOC~~ SOLN
5.0000 [IU] | Freq: Two times a day (BID) | SUBCUTANEOUS | Status: DC
  Administered 2022-10-07 – 2022-10-11 (×7): 5 [IU] via SUBCUTANEOUS
  Filled 2022-10-07 (×9): qty 0.05

## 2022-10-07 MED ORDER — ACYCLOVIR 200 MG PO CAPS
800.0000 mg | ORAL_CAPSULE | Freq: Every day | ORAL | Status: DC
  Administered 2022-10-07 – 2022-10-11 (×5): 800 mg via ORAL
  Filled 2022-10-07 (×5): qty 4

## 2022-10-07 MED ORDER — CARVEDILOL 3.125 MG PO TABS
3.1250 mg | ORAL_TABLET | Freq: Two times a day (BID) | ORAL | Status: DC
  Administered 2022-10-07 – 2022-10-11 (×8): 3.125 mg via ORAL
  Filled 2022-10-07 (×8): qty 1

## 2022-10-07 NOTE — Assessment & Plan Note (Signed)
Estimated body mass index is 38.27 kg/m as calculated from the following:   Height as of this encounter: '5\' 5"'$  (1.651 m).   Weight as of this encounter: 104.3 kg.   -This will complicate overall prognosis -Encourage weight loss

## 2022-10-07 NOTE — Progress Notes (Signed)
Progress Note   Patient: Jennifer Weiss PNT:[id number] DOB: Jan 22, 1961 DOA: 10/06/2022     1 DOS: the patient was seen and examined on 10/07/2022   Brief hospital course: Taken from H&P.  Jennifer Weiss is a 61 y.o. African-American female with medical history significant for asthma type 2 diabetes mellitus, hypertension, seizure disorder, carotid artery disease, who presented to the ER with acute worsening dyspnea for 1 day. Patient has exertional dyspnea for the past 2 to 17-month  Also complaining of mild cough productive of yellowish sputum.  She admits having some wheezing and subjective fever and chills, no documentation.  No chest pain or palpitations.  She has some orthopnea and PND along with worsening lower extremity edema.  ED course.  On arrival blood pressure was 151/90, heart rate 103, sat sitting well on room air.  Labs with creatinine 1.33 with baseline below 1 and BNP of 719.8. EKG with sinus tachycardia, Q waves in inferior leads. Chest x-ray with pulmonary edema and trace left pleural effusion.  Patient was given 40 mg of IV Lasix and started on IV diuresis Cardiology was consulted.  10/24: Creatinine improved to 1.27, TSH within normal limit at 0.433.  COVID PCR negative.  Troponin negative. Echocardiogram with new diagnosis of HFrEF with EF of 25 to 30% and severely depressed LV function, global hypokinesis, grade 2 diastolic dysfunction, moderately dilated left side with mitral valve regurgitation. Noted to be on acyclovir 800 mg twice daily-Per patient she is taking it for many years for genital herpes.  Do not see any history of immunocompromised status, ask pharmacy to verify.      Assessment and Plan: * Acute HFrEF (heart failure with reduced ejection fraction) (HCC) Echocardiogram done today with new diagnosis of reduced EF and grade 2 diastolic dysfunction. Prior echocardiogram with normal EF and diastolic dysfunction. Cardiology was consulted. -Continue with  IV Lasix -Daily weight and BMP -Strict intake and output  Essential hypertension Blood pressure within goal -Continue home amlodipine. -Keep holding Cozaar for AKI  Type 2 diabetes mellitus with peripheral neuropathy (HCC) CBG elevated.  A1c pending -Add Semglee 5 units twice daily -Continue with SSI  Anxiety and depression - We will continue her SSRI.  Dyslipidemia - We will continue her statin therapy.  Chronic obstructive pulmonary disease (COPD) (HCC) - We will continue her inhalers while holding off long-acting beta agonist in the setting of acute CHF.  Obesity (BMI 30-39.9) Estimated body mass index is 38.27 kg/m as calculated from the following:   Height as of this encounter: '5\' 5"'$  (1.651 m).   Weight as of this encounter: 104.3 kg.   -This will complicate overall prognosis -Encourage weight loss  Tobacco abuse - Counseling was provided -Nicotine patch as needed.   Subjective: Patient was seen and examined today denies any shortness of breath.  No chest pain.  Asked about acyclovir, per patient she was using it for many years for concern of genital herpes.  Physical Exam: Vitals:   10/06/22 2130 10/06/22 2332 10/07/22 0619 10/07/22 1100  BP: (!) 157/96  131/78 130/82  Pulse: 93  86 84  Resp: 19  (!) 22 20  Temp:  98.4 F (36.9 C) 98 F (36.7 C) 98.6 F (37 C)  TempSrc:  Oral Oral Oral  SpO2: 92%  95% 96%  Weight:   104.3 kg   Height:   '5\' 5"'$  (1.651 m)    General.  Obese lady, in no acute distress. Pulmonary.  Lungs clear bilaterally, normal respiratory effort.  CV.  Regular rate and rhythm, no JVD, rub or murmur. Abdomen.  Soft, nontender, nondistended, BS positive. CNS.  Alert and oriented .  No focal neurologic deficit. Extremities.  Trace LE edema, no cyanosis, pulses intact and symmetrical. Psychiatry.  Judgment and insight appears normal.  Data Reviewed: Prior data reviewed  Family Communication: Discussed with patient, called son with no  response.  Disposition: Status is: Inpatient Remains inpatient appropriate because: Severity of illness   Planned Discharge Destination: Home  DVT prophylaxis.  Lovenox Time spent: 50 minutes  This record has been created using Systems analyst. Errors have been sought and corrected,but may not always be located. Such creation errors do not reflect on the standard of care.  Author: Lorella Nimrod, MD 10/07/2022 1:53 PM  For on call review [email or web address].

## 2022-10-07 NOTE — Progress Notes (Signed)
Nutrition Brief Note  RD pulled to chart secondary to CHF.   Wt Readings from Last 15 Encounters:  10/07/22 104.3 kg  09/18/17 100.4 kg  05/30/17 98.1 kg  02/15/17 90.7 kg  01/02/17 98.2 kg  11/19/16 94.8 kg  11/08/16 99.8 kg   Pt with medical history significant for asthma type 2 diabetes mellitus, hypertension, seizure disorder, carotid artery disease, who presented with acute onset of worsening dyspnea over the last 2 to 3 months and got significantly worse today.  Pt admitted with CHF.   RD provided "Low Sodium Nutrition Therapy" handout from [facility] Nutrition Care Manual; attached to AVS/ discharge summary.   Medications reviewed and include lasix.   Lab Results  Component Value Date   HGBA1C 6.0 (H) 09/18/2017   PTA DM medications are none.   Labs reviewed: CBGS: 172-251 (inpatient orders for glycemic control are 0-5 units insulin aspart daily at bedtime and 0-9 units insulin aspart TID with meals).    Body mass index is 38.27 kg/m. Patient meets criteria for obesity, class II based on current BMI. Obesity is a complex, chronic medical condition that is optimally managed by a multidisciplinary care team. Weight loss is not an ideal goal for an acute inpatient hospitalization. However, if further work-up for obesity is warranted, consider outpatient referral to outpatient bariatric service and/or [facility] Nutrition and Diabetes Education Services.    Current diet order is carb modified (changed to 2 gram sodium), patient is consuming approximately n/a% of meals at this time. Labs and medications reviewed.   No nutrition interventions warranted at this time. If nutrition issues arise, please consult RD.   Loistine Chance, RD, LDN, [place] Registered Dietitian II Certified Diabetes Care and Education Specialist Please refer to [facility] for RD and/or RD on-call/weekend/after hours pager

## 2022-10-07 NOTE — Assessment & Plan Note (Addendum)
-   Counseling was provided -Nicotine patch as needed.

## 2022-10-07 NOTE — Discharge Instructions (Signed)

## 2022-10-07 NOTE — Consult Note (Signed)
Cardiology Consultation:   Patient ID: Jennifer Weiss; [id number]; 04-03-1961   Admit date: 10/06/2022 Date of Consult: 10/07/2022  Primary Care Provider: [place] Primary Cardiologist: New - consult by End Primary Electrophysiologist:  None   Patient Profile:   Jennifer Weiss is a 61 y.o. 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 Sidney Ace.  History of Present Illness:   Jennifer Weiss has previously undergone remote PET cardial imaging in 2014 at [facility] that was abnormal, though probably low risk with no evidence of infarction.  We evaluated the patient during inpatient admission in 09/2017 for atypical chest pain and syncope of uncertain etiology.  Echo at that time demonstrated an EF of 60 to 65%, no regional wall motion abnormalities, grade 1 diastolic dysfunction, mild mitral regurgitation, mildly dilated left atrium, normal RV systolic function and ventricular cavity size, and mild to moderate tricuspid regurgitation.  Carotid artery ultrasound was suggestive of 50 to 69% right ICA stenosis.  EEG was nonrevealing.  Outpatient cardiac monitoring and stress test were recommended, though the patient was lost to follow-up.  She underwent echo at [facility] in 02/2018 which demonstrated an EF of 40 to 41%, grade 1 diastolic dysfunction, and trivial mitral regurgitation.  She was admitted to [facility] on 10/06/2022 with an at least 1 month history of progressive exertional dyspnea, lower extremity swelling, abdominal distention, and orthopnea.  With this, there has been some exertional chest tightness.  She is uncertain of weight gain.  She does not specifically recall eating a diet high in sodium, though does not historically watch her sodium intake at baseline.  Not currently on a diuretic.  She does report  adherence to losartan.  She reports smoking 1 cigarette/day.  She denies any significant alcohol intake or illicit substance use.  No fevers.  Upon presentation to [facility] BP was elevated in the 150s over 90s.  EKG showed sinus tachycardia with nonspecific ST-T changes.  Chest x-ray showed pulmonary edema with trace left pleural effusion with possible superimposed infection.  High-sensitivity opponent negative x1.  BNP 719.  Hgb 10.5.  BUN/SCR 16/1.33 trending to 16/1.27.  She has been receiving IV Lasix 40 mg twice daily with a documented urine output of 2 L to date.  Echo performed earlier today demonstrated an EF of 25 to 30%, global hypokinesis, moderately dilated LV internal cavity size, grade 2 diastolic dysfunction, normal RV systolic function and ventricular cavity size, mildly dilated left atrium, and moderate mitral regurgitation.  She reports an improvement in her underlying dyspnea.      Past Medical History:  Diagnosis Date   Arthritis    Asthma    Cancer ([place])    ovarian   Carotid arterial disease ([place])    a. 09/2017 Carotid U/S: 50-69% @ R carotid bifurcation. LICA ok.   Diabetes mellitus without complication ([place])    Hypertension    Remote history of stroke    Seizures ([place])    Tobacco abuse     Past Surgical History:  Procedure Laterality Date   none       Home Meds: Prior to Admission medications   Medication Sig Start Date End Date Taking? Authorizing Provider  acyclovir (ZOVIRAX) 800 MG tablet Take 800 mg by mouth daily.   Yes [provider]  amitriptyline (ELAVIL) 50 MG tablet  Take 50 mg by mouth at bedtime. 06/16/22  Yes [provider]  ASPIR-LOW 81 MG EC tablet Take 81 mg by mouth daily.   Yes [provider]  atorvastatin (LIPITOR) 80 MG tablet Take 80 mg by mouth daily. 09/23/22  Yes [provider]  bisacodyl (DULCOLAX) 5 MG EC tablet Take 5 mg by mouth daily as needed for moderate constipation.   Yes [provider]   Cholecalciferol (VITAMIN D3) 25 MCG (1000 UT) CAPS Take 1,000 Units by mouth daily. 04/21/22 04/21/23 Yes [provider]  FLUoxetine (PROZAC) 20 MG capsule Take 20 mg by mouth daily. 09/23/22  Yes [provider]  fluticasone furoate-vilanterol (BREO ELLIPTA) 100-25 MCG/ACT AEPB Inhale 1 puff into the lungs daily.   Yes [provider]  gabapentin (NEURONTIN) 300 MG capsule Take 300 mg by mouth at bedtime.   Yes [provider]  ipratropium (ATROVENT HFA) 17 MCG/ACT inhaler Inhale 2 puffs into the lungs 2 (two) times daily.   Yes [provider]  losartan (COZAAR) 100 MG tablet Take 100 mg by mouth daily. 07/16/22  Yes [provider]  metFORMIN (GLUCOPHAGE) 500 MG tablet Take 500 mg by mouth 2 (two) times daily with a meal.   Yes [provider]  pyridOXINE (VITAMIN B6) 25 MG tablet Take 1 tablet by mouth daily. 03/17/22 03/17/23 Yes [provider]  trolamine salicylate (ASPERCREME) 10 % cream Apply 1 Application topically as needed for muscle pain.   Yes [provider]  varenicline (CHANTIX) 1 MG tablet Take 1 mg by mouth 2 (two) times daily. 07/17/22  Yes [provider]  albuterol (PROVENTIL HFA;VENTOLIN HFA) 108 (90 Base) MCG/ACT inhaler Inhale 2 puffs into the lungs every 4 (four) hours as needed for wheezing or shortness of breath.    [provider]  amLODipine (NORVASC) 5 MG tablet Take 1 tablet (5 mg total) by mouth daily. Patient not taking: Reported on 10/06/2022 09/20/17   Gladstone Lighter, MD  budesonide-formoterol [facility]) 160-4.5 MCG/ACT inhaler Inhale 2 puffs into the lungs 2 (two) times daily. Patient not taking: Reported on 10/06/2022    [provider]  fluticasone (FLOVENT HFA) 110 MCG/ACT inhaler Inhale 2 puffs into the lungs 2 (two) times daily. Patient not taking: Reported on 10/06/2022    [provider]  ipratropium-albuterol (DUONEB) 0.5-2.5 (3) MG/3ML SOLN  Take 3 mLs by nebulization every 4 (four) hours as needed (Wheezing or shortness of breath). Patient not taking: Reported on 10/06/2022 05/30/17   Hillary Bow, MD  sertraline (ZOLOFT) 25 MG tablet Take 1 tablet (25 mg total) by mouth daily. Patient not taking: Reported on 10/06/2022 09/20/17   Gladstone Lighter, MD  traZODone (DESYREL) 50 MG tablet Take 1 tablet (50 mg total) by mouth at bedtime as needed for sleep. Patient not taking: Reported on 10/06/2022 09/20/17   Gladstone Lighter, MD    Inpatient Medications: Scheduled Meds:  acyclovir  800 mg Oral Daily   aspirin EC  81 mg Oral Daily   atorvastatin  40 mg Oral Daily   enoxaparin (LOVENOX) injection  40 mg Subcutaneous Q24H   furosemide  40 mg Intravenous Q12H   gabapentin  300 mg Oral TID   insulin aspart  0-5 Units Subcutaneous QHS   insulin aspart  0-9 Units Subcutaneous TID WC   ipratropium  0.5 mg Inhalation BID   Continuous Infusions:  PRN Meds: acetaminophen **OR** acetaminophen, albuterol, ipratropium-albuterol, magnesium hydroxide, ondansetron **OR** ondansetron (ZOFRAN) IV, traZODone  Allergies:  Allergies  Allergen Reactions   Metronidazole Hives   Nystatin Other (See Comments)    Reaction: unknown   Lisinopril Other (See Comments)    cough cough     Social History:   Social History   Socioeconomic History   Marital status: Widowed    Spouse name: Not on file   Number of children: Not on file   Years of education: Not on file   Highest education level: Not on file  Occupational History   Not on file  Tobacco Use   Smoking status: Every Day    Packs/day: 0.50    Types: Cigarettes   Smokeless tobacco: Never  Vaping Use   Vaping Use: Never used  Substance and Sexual Activity   Alcohol use: No   Drug use: Not on file   Sexual activity: Not on file  Other Topics Concern   Not on file  Social History Narrative   Lives locally with niece.   Social Determinants of Health   Financial  Resource Strain: Not on file  Food Insecurity: Not on file  Transportation Needs: Not on file  Physical Activity: Not on file  Stress: Not on file  Social Connections: Not on file  Intimate Partner Violence: Not on file     Family History:   Family History  Problem Relation Age of Onset   Diabetes Mellitus II Mother     ROS:  Review of Systems  Constitutional:  Positive for chills and malaise/fatigue. Negative for diaphoresis, fever and weight loss.  HENT:  Negative for congestion.   Eyes:  Negative for discharge and redness.  Respiratory:  Positive for shortness of breath. Negative for cough, sputum production and wheezing.   Cardiovascular:  Positive for chest pain, orthopnea and leg swelling. Negative for palpitations, claudication and PND.  Gastrointestinal:  Negative for abdominal pain, heartburn, nausea and vomiting.  Genitourinary:  Negative for hematuria.  Musculoskeletal:  Negative for falls and myalgias.  Skin:  Negative for rash.  Neurological:  Negative for dizziness, tingling, tremors, sensory change, speech change, focal weakness, loss of consciousness and weakness.  Endo/Heme/Allergies:  Does not bruise/bleed easily.  Psychiatric/Behavioral:  Negative for substance abuse. The patient is not nervous/anxious.   All other systems reviewed and are negative.     Physical Exam/Data:   Vitals:   10/06/22 2130 10/06/22 2332 10/07/22 0619 10/07/22 1100  BP: (!) 157/96  131/78 130/82  Pulse: 93  86 84  Resp: 19  (!) 22 20  Temp:  98.4 F (36.9 C) 98 F (36.7 C) 98.6 F (37 C)  TempSrc:  Oral Oral Oral  SpO2: 92%  95% 96%  Weight:   104.3 kg   Height:   '5\' 5"'$  (1.651 m)     Intake/Output Summary (Last 24 hours) at 10/07/2022 1351 Last data filed at 10/07/2022 1000 Gross per 24 hour  Intake --  Output 2600 ml  Net -2600 ml   Filed Weights   10/06/22 1840 10/07/22 0619  Weight: 94.8 kg 104.3 kg   Body mass index is 38.27 kg/m.   Physical Exam: General:  Well developed, well nourished, in no acute distress. Head: Normocephalic, atraumatic, sclera non-icteric, no xanthomas, nares without discharge.  Neck: Negative for carotid bruits. JVD elevated approximately 8 cm. Lungs: Diminished breath sounds along the bilateral bases, left greater than right. Breathing is unlabored. Heart: RRR with S1 S2. II/VI systolic murmur LSB, no rubs, or gallops appreciated. Abdomen: Soft, non-tender, non-distended with normoactive bowel sounds. No hepatomegaly.  No rebound/guarding. No obvious abdominal masses. Msk:  Strength and tone appear normal for age. Extremities: No clubbing or cyanosis. No edema. Distal pedal pulses are 2+ and equal bilaterally. Neuro: Alert and oriented X 3. No facial asymmetry. No focal deficit. Moves all extremities spontaneously. Psych:  Responds to questions appropriately with a normal affect.   EKG:  The EKG was personally reviewed and demonstrates: Sinus tachycardia, 101 bpm, nonspecific ST-T changes Telemetry:  Telemetry was personally reviewed and demonstrates: SR with PVCs and artifact  Weights: Filed Weights   10/06/22 1840 10/07/22 0619  Weight: 94.8 kg 104.3 kg    Relevant CV Studies:  2D echo 10/07/2022: 1. Left ventricular ejection fraction, by estimation, is 25 to 30%. The  left ventricle has severely decreased function. The left ventricle  demonstrates global hypokinesis. The left ventricular internal cavity size  was moderately dilated. Left  ventricular diastolic parameters are consistent with Grade II diastolic  dysfunction (pseudonormalization).   2. Right ventricular systolic function is normal. The right ventricular  size is normal.   3. Left atrial size was mildly dilated.   4. The mitral valve is normal in structure. Moderate mitral valve  regurgitation.   5. The aortic valve is normal in structure. Aortic valve regurgitation is  not visualized.  __________  2D echo 09/18/2017: - Left ventricle: The  cavity size was normal. Wall thickness was    increased in a pattern of mild LVH. Systolic function was normal.    The estimated ejection fraction was in the range of 60% to 65%.    Wall motion was normal; there were no regional wall motion    abnormalities. Doppler parameters are consistent with abnormal    left ventricular relaxation (grade 1 diastolic dysfunction).  - Mitral valve: There was mild regurgitation.  - Left atrium: The atrium was mildly dilated.  - Right ventricle: The cavity size was normal. Systolic function    was normal.  - Tricuspid valve: There was mild-moderate regurgitation.   Laboratory Data:  Chemistry Recent Labs  Lab 10/06/22 1927 10/07/22 0535  NA 140 138  K 3.6 4.0  CL 107 106  CO2 23 22  GLUCOSE 117* 265*  BUN 16 16  CREATININE 1.33* 1.27*  CALCIUM 8.9 8.8*  GFRNONAA 46* 48*  ANIONGAP 10 10    Recent Labs  Lab 10/06/22 1847  PROT 7.4  ALBUMIN 3.5  AST 27  ALT 22  ALKPHOS 110  BILITOT 0.8   Hematology Recent Labs  Lab 10/06/22 1843 10/07/22 0535  WBC 7.4 6.2  RBC 3.54* 3.79*  HGB 9.6* 10.5*  HCT 32.6* 34.5*  MCV 92.1 91.0  MCH 27.1 27.7  MCHC 29.4* 30.4  RDW 17.0* 17.2*  PLT 176 194   Cardiac EnzymesNo results for input(s): "TROPONINI" in the last 168 hours. No results for input(s): "TROPIPOC" in the last 168 hours.  BNP Recent Labs  Lab 10/06/22 1847  BNP 719.8*    DDimer No results for input(s): "DDIMER" in the last 168 hours.  Radiology/Studies:   DG Chest 1 View  Result Date: 10/06/2022 IMPRESSION: Pulmonary edema with trace left pleural effusion. Superimposed infection not excluded. Electronically Signed   By: Iven Finn M.D.   On: 10/06/2022 19:31    Assessment and Plan:   1.  Acute HFrEF: -Uncertain etiology, though cannot exclude ischemic given risk factors -Add carvedilol 3.125 mg twice daily and losartan 12.5 mg daily with recommendation to likely transition ARB to ARNI prior to discharge -Continue  escalation of GDMT throughout admission -Following diuresis, we will plan to pursue [facility], likely 10/09/2022 for further evaluation of her cardiomyopathy and to further evaluate hemodynamic status -Pending cardiac cath, and following escalation of GDMT, would recommend repeat echo in the outpatient setting in several months time, if her EF remains less than 35% at that time would recommend referral to EP for consideration of ICD and to the advanced heart failure service, pending medical adherence -Daily weights -Strict I's and O's -CHF education  2.  AKI: -Improving with diuresis -Monitor  3.  Normocytic anemia: -No obvious source of bleed -Management per primary service  4.  HTN: -Blood pressure has been on the higher side, though overall stable -Medical therapy as outlined above  5.  HLD: -LDL 64 in 02/2022 -PTA atorvastatin  6.  Tobacco use: -Complete cessation recommended  7.  DM2: -A1c 6.2 -Management per primary service     For questions or updates, please contact [place] Please consult [email or web address] for contact info under Cardiology/STEMI.   Signed, Christell Faith, PA-C [place] Pager: [phone] 10/07/2022, 1:51 PM

## 2022-10-07 NOTE — Hospital Course (Addendum)
Taken from H&P.  Jennifer Weiss is a 61 y.o. African-American female with medical history significant for asthma type 2 diabetes mellitus, hypertension, seizure disorder, carotid artery disease, who presented to the ER with acute worsening dyspnea for 1 day. Patient has exertional dyspnea for the past 2 to 28-month  Also complaining of mild cough productive of yellowish sputum.  She admits having some wheezing and subjective fever and chills, no documentation.  No chest pain or palpitations.  She has some orthopnea and PND along with worsening lower extremity edema.  ED course.  On arrival blood pressure was 151/90, heart rate 103, sat sitting well on room air.  Labs with creatinine 1.33 with baseline below 1 and BNP of 719.8. EKG with sinus tachycardia, Q waves in inferior leads. Chest x-ray with pulmonary edema and trace left pleural effusion.  Patient was given 40 mg of IV Lasix and started on IV diuresis Cardiology was consulted.  10/24: Creatinine improved to 1.27, TSH within normal limit at 0.433.  COVID PCR negative.  Troponin negative. Echocardiogram with new diagnosis of HFrEF with EF of 25 to 30% and severely depressed LV function, global hypokinesis, grade 2 diastolic dysfunction, moderately dilated left side with mitral valve regurgitation. Noted to be on acyclovir 800 mg twice daily-Per patient she is taking it for many years for genital herpes.  Do not see any history of immunocompromised status, ask pharmacy to verify.  10/25: Shortness of breath improving.  Creatinine seems stable at 1.26, IV Lasix dose was decreased to daily due to worsening BUN.  Cardiac catheterization tomorrow.

## 2022-10-07 NOTE — Progress Notes (Signed)
Patient arrived to room 260. Patient oriented to room and call bell in reach.

## 2022-10-08 DIAGNOSIS — Z720 Tobacco use: Secondary | ICD-10-CM | POA: Diagnosis not present

## 2022-10-08 DIAGNOSIS — I10 Essential (primary) hypertension: Secondary | ICD-10-CM | POA: Diagnosis not present

## 2022-10-08 DIAGNOSIS — I5021 Acute systolic (congestive) heart failure: Secondary | ICD-10-CM | POA: Diagnosis not present

## 2022-10-08 LAB — BASIC METABOLIC PANEL
Anion gap: 5 (ref 5–15)
BUN: 24 mg/dL — ABNORMAL HIGH (ref 8–23)
CO2: 32 mmol/L (ref 22–32)
Calcium: 9.1 mg/dL (ref 8.9–10.3)
Chloride: 104 mmol/L (ref 98–111)
Creatinine, Ser: 1.26 mg/dL — ABNORMAL HIGH (ref 0.44–1.00)
GFR, Estimated: 49 mL/min — ABNORMAL LOW (ref >60)
Glucose, Bld: 174 mg/dL — ABNORMAL HIGH (ref 70–99)
Potassium: 3.6 mmol/L (ref 3.5–5.1)
Sodium: 141 mmol/L (ref 135–145)

## 2022-10-08 LAB — GLUCOSE, CAPILLARY
Glucose-Capillary: 86 mg/dL (ref 70–99)
Glucose-Capillary: 116 mg/dL — ABNORMAL HIGH (ref 70–99)
Glucose-Capillary: 173 mg/dL — ABNORMAL HIGH (ref 70–99)
Glucose-Capillary: 166 mg/dL — ABNORMAL HIGH (ref 70–99)

## 2022-10-08 MED ORDER — SODIUM CHLORIDE 0.9% FLUSH: 3.0000 mL | INTRAVENOUS | Status: DC | PRN

## 2022-10-08 MED ORDER — FUROSEMIDE 10 MG/ML IJ SOLN
40.0000 mg | Freq: Every day | INTRAMUSCULAR | Status: DC
  Administered 2022-10-08: 40 mg via INTRAVENOUS
  Filled 2022-10-08: qty 4

## 2022-10-08 MED ORDER — SODIUM CHLORIDE 0.9% FLUSH
3.0000 mL | Freq: Two times a day (BID) | INTRAVENOUS | Status: DC
  Administered 2022-10-09 (×2): 3 mL via INTRAVENOUS

## 2022-10-08 MED ORDER — SODIUM CHLORIDE 0.9 % IV SOLN
250.0000 mL | INTRAVENOUS | Status: DC | PRN
  Administered 2022-10-09: 250 mL via INTRAVENOUS

## 2022-10-08 MED ORDER — SODIUM CHLORIDE 0.9 % IV SOLN: INTRAVENOUS | Status: DC

## 2022-10-08 MED ORDER — FUROSEMIDE 10 MG/ML IJ SOLN: 40.0000 mg | Freq: Every day | INTRAMUSCULAR | Status: DC

## 2022-10-08 NOTE — Progress Notes (Signed)
Progress Note   Patient: Jennifer Weiss ERX:[id number] DOB: 18-Aug-1961 DOA: 10/06/2022     2 DOS: the patient was seen and examined on 10/08/2022   Brief hospital course: Taken from H&P.  Jennifer Weiss is a 61 y.o. African-American female with medical history significant for asthma type 2 diabetes mellitus, hypertension, seizure disorder, carotid artery disease, who presented to the ER with acute worsening dyspnea for 1 day. Patient has exertional dyspnea for the past 2 to 23-month  Also complaining of mild cough productive of yellowish sputum.  She admits having some wheezing and subjective fever and chills, no documentation.  No chest pain or palpitations.  She has some orthopnea and PND along with worsening lower extremity edema.  ED course.  On arrival blood pressure was 151/90, heart rate 103, sat sitting well on room air.  Labs with creatinine 1.33 with baseline below 1 and BNP of 719.8. EKG with sinus tachycardia, Q waves in inferior leads. Chest x-ray with pulmonary edema and trace left pleural effusion.  Patient was given 40 mg of IV Lasix and started on IV diuresis Cardiology was consulted.  10/24: Creatinine improved to 1.27, TSH within normal limit at 0.433.  COVID PCR negative.  Troponin negative. Echocardiogram with new diagnosis of HFrEF with EF of 25 to 30% and severely depressed LV function, global hypokinesis, grade 2 diastolic dysfunction, moderately dilated left side with mitral valve regurgitation. Noted to be on acyclovir 800 mg twice daily-Per patient she is taking it for many years for genital herpes.  Do not see any history of immunocompromised status, ask pharmacy to verify.  10/25: Shortness of breath improving.  Creatinine seems stable at 1.26, IV Lasix dose was decreased to daily due to worsening BUN.  Cardiac catheterization tomorrow.   Assessment and Plan: * Acute HFrEF (heart failure with reduced ejection fraction) (HCC) Echocardiogram with new diagnosis of  reduced EF of 20 to 25% and grade 2 diastolic dysfunction. Prior echocardiogram with normal EF and diastolic dysfunction. Cardiology was consulted-cardiac catheterization tomorrow -Continue with IV Lasix-dose decreased to once daily -Daily weight and BMP -Strict intake and output  Essential hypertension Blood pressure within goal -Continue home amlodipine. -Patient is also on IV Lasix -Keep holding Cozaar for AKI  Type 2 diabetes mellitus with peripheral neuropathy (HCC) CBG within goal today.  A1c of 6.2 -Continue Semglee 5 units twice daily -Continue with SSI  Anxiety and depression - We will continue her SSRI.  Dyslipidemia - We will continue her statin therapy.  Chronic obstructive pulmonary disease (COPD) (HCC) - We will continue her inhalers while holding off long-acting beta agonist in the setting of acute CHF.  Obesity (BMI 30-39.9) Estimated body mass index is 38.27 kg/m as calculated from the following:   Height as of this encounter: '5\' 5"'$  (1.651 m).   Weight as of this encounter: 104.3 kg.   -This will complicate overall prognosis -Encourage weight loss  Tobacco abuse - Counseling was provided -Nicotine patch as needed.    Subjective: Patient was seen and examined today.  Shortness of breath seems improving.  Denies any chest pain.  Physical Exam: Vitals:   10/08/22 0700 10/08/22 0836 10/08/22 0957 10/08/22 1221  BP:  115/81  119/81  Pulse:  85  92  Resp:    18  Temp:  97.9 F (36.6 C)  97.6 F (36.4 C)  TempSrc:  Oral  Oral  SpO2: 97%   96%  Weight:   103.9 kg   Height:  General.  Obese lady, in no acute distress. Pulmonary.  Lungs clear bilaterally, normal respiratory effort. CV.  Regular rate and rhythm, no JVD, rub or murmur. Abdomen.  Soft, nontender, nondistended, BS positive. CNS.  Alert and oriented .  No focal neurologic deficit. Extremities.  No edema, no cyanosis, pulses intact and symmetrical. Psychiatry.  Judgment and  insight appears normal.   Data Reviewed: Prior data reviewed  Family Communication: Discussed with patient.  Disposition: Status is: Inpatient Remains inpatient appropriate because: Severity of illness   Planned Discharge Destination: Home  DVT prophylaxis.  Lovenox Time spent: 45 minutes  This record has been created using Systems analyst. Errors have been sought and corrected,but may not always be located. Such creation errors do not reflect on the standard of care.  Author: Lorella Nimrod, MD 10/08/2022 3:37 PM  For on call review [email or web address].

## 2022-10-08 NOTE — Assessment & Plan Note (Signed)
CBG within goal today.  A1c of 6.2 -Continue Semglee 5 units twice daily -Continue with SSI

## 2022-10-08 NOTE — Consult Note (Addendum)
   Heart Failure Nurse Navigator Note  HFrEF 25 to 30%.  Left ventricle with global hypokinesis.  Grade 2 diastolic dysfunction.  Mild left atrial enlargement.  Mild mitral regurgitation.  She presented to the emergency room with complaints of worsening dyspnea over 1 month, also noted DOE, PND and lower extremity edema.  BNP was 719.  Chest x-ray revealed pulmonary edema and trace left pleural effusion.  Comorbidities:  Asthma Type 2 diabetes Hypertension Seizure disorder Carotid artery disease Tobacco abuse Anxiety/depression  Patient is unable to read or write.  Medications:  Aspirin 81 mg daily Atorvastatin 40 mg daily Carvedilol 3.125 mg daily with meals Furosemide 40 mg daily Losartan 25 mg daily  Cardiology plans to add other GDMT.   Labs:  Sodium 141, potassium 3.6, chloride 104, CO2 32, BUN 24, Aten 1.26, GFR 49, hemoglobin A1c 6.2, hemoglobin 10.5, hematocrit 34.5. Weight not obtained Intake 240 mL Output 1700 mL Blood pressure 119/81   Initial meeting with patient today she is lying in bed in no acute distress.  There are no family members at the bedside.  She states that she resides with a daughter and a granddaughter.  Patient tells me that she is unable to read or write and some times can write her name.  She states that the daughter and granddaughter can read and write.  Discussed heart failure and what it means.  She understood that the function of her heart was weakened, from what the doctors had told her.  Went over fluid restriction, sodium restriction, daily weights and what to report.  She states she has a scale.  Discussed low sodium foods -what to eat and what to avoid.  She states she eats noodles that comes with a seasoning packet.  Advised that that probably is higher in sodium content and to season  the noodles herself with other spices than salt.  Given examples.  She was given the living with heart failure handbook, zone magnet, info on  low-sodium and heart failure along with weight chart to share with her daughter and granddaughter.  Discussed if getting her medications was a problem, she stated no that everything is covered through Medicare.  And as far as transportation that is also not a problem that her granddaughter drives her to the needed appointments.  She has follow-up in the outpatient heart failure clinic on November 1 at 3:30 in the afternoon she has a 0% no-show which is 0 out of 14 appointments.  Pricilla Riffle RN [facility]

## 2022-10-08 NOTE — Assessment & Plan Note (Signed)
Blood pressure within goal -Continue home amlodipine. -Patient is also on IV Lasix -Keep holding Cozaar for AKI

## 2022-10-08 NOTE — Plan of Care (Signed)
Patient resting in bed. No changes noted at this time.

## 2022-10-08 NOTE — Assessment & Plan Note (Signed)
Echocardiogram with new diagnosis of reduced EF of 20 to 25% and grade 2 diastolic dysfunction. Prior echocardiogram with normal EF and diastolic dysfunction. Cardiology was consulted-cardiac catheterization tomorrow -Continue with IV Lasix-dose decreased to once daily -Daily weight and BMP -Strict intake and output

## 2022-10-08 NOTE — Progress Notes (Signed)
Progress Note  Patient Name: Jennifer Weiss Date of Encounter: 10/08/2022  Primary Cardiologist: New - consult by End  Subjective   Dyspnea improving. Chest pain associated with cough. Documented UOP 1.4 L for the past 24 hours, net - 3.3 L for the admission. No weight. BUN trending up, stable SCr.  Inpatient Medications    Scheduled Meds:  acyclovir  800 mg Oral Daily   aspirin EC  81 mg Oral Daily   atorvastatin  40 mg Oral Daily   carvedilol  3.125 mg Oral BID WC   enoxaparin (LOVENOX) injection  40 mg Subcutaneous Q24H   furosemide  40 mg Intravenous BID   gabapentin  300 mg Oral TID   insulin aspart  0-5 Units Subcutaneous QHS   insulin aspart  0-9 Units Subcutaneous TID WC   insulin glargine-yfgn  5 Units Subcutaneous BID   ipratropium  0.5 mg Inhalation BID   losartan  25 mg Oral Daily   Continuous Infusions:  PRN Meds: acetaminophen **OR** acetaminophen, albuterol, ipratropium-albuterol, magnesium hydroxide, ondansetron **OR** ondansetron (ZOFRAN) IV, traZODone   Vital Signs    Vitals:   10/07/22 2000 10/07/22 2049 10/07/22 2354 10/08/22 0308  BP: 130/73  135/89 125/80  Pulse: 90  92 83  Resp: '20  16 16  '$ Temp: 98.1 F (36.7 C)  97.9 F (36.6 C) 97.7 F (36.5 C)  TempSrc:   Oral Oral  SpO2: 95% 96% 94% 94%  Weight:      Height:        Intake/Output Summary (Last 24 hours) at 10/08/2022 6962 Last data filed at 10/08/2022 0300 Gross per 24 hour  Intake 240 ml  Output 1700 ml  Net -1460 ml   Filed Weights   10/06/22 1840 10/07/22 0619  Weight: 94.8 kg 104.3 kg    Telemetry    SR with sinus tachycardia, 80s to low 100s bpm, rare PVCs - Personally Reviewed  ECG    No new tracings - Personally Reviewed  Physical Exam   GEN: No acute distress.   Neck: JVD elevated ~ 8 cm. Cardiac: RRR, II/VI systolic murmur LSB, no rubs, or gallops.  Respiratory: Mildly diminished bilaterally.  GI: Soft, nontender, non-distended.   MS: No edema; No  deformity. Neuro:  Alert and oriented x 3; Nonfocal.  Psych: Normal affect.  Labs    Chemistry Recent Labs  Lab 10/06/22 1847 10/06/22 1927 10/07/22 0535 10/08/22 0409  NA  --  140 138 141  K  --  3.6 4.0 3.6  CL  --  107 106 104  CO2  --  23 22 32  GLUCOSE  --  117* 265* 174*  BUN  --  16 16 24*  CREATININE  --  1.33* 1.27* 1.26*  CALCIUM  --  8.9 8.8* 9.1  PROT 7.4  --   --   --   ALBUMIN 3.5  --   --   --   AST 27  --   --   --   ALT 22  --   --   --   ALKPHOS 110  --   --   --   BILITOT 0.8  --   --   --   GFRNONAA  --  46* 48* 49*  ANIONGAP  --  '10 10 5     '$ Hematology Recent Labs  Lab 10/06/22 1843 10/07/22 0535  WBC 7.4 6.2  RBC 3.54* 3.79*  HGB 9.6* 10.5*  HCT 32.6* 34.5*  MCV 92.1  91.0  MCH 27.1 27.7  MCHC 29.4* 30.4  RDW 17.0* 17.2*  PLT 176 194    Cardiac EnzymesNo results for input(s): "TROPONINI" in the last 168 hours. No results for input(s): "TROPIPOC" in the last 168 hours.   BNP Recent Labs  Lab 10/06/22 1847  BNP 719.8*     DDimer No results for input(s): "DDIMER" in the last 168 hours.   Radiology    DG Chest 1 View  Result Date: 10/06/2022 IMPRESSION: Pulmonary edema with trace left pleural effusion. Superimposed infection not excluded. Electronically Signed   By: Iven Finn M.D.   On: 10/06/2022 19:31    Cardiac Studies   2D echo 10/07/2022: 1. Left ventricular ejection fraction, by estimation, is 25 to 30%. The  left ventricle has severely decreased function. The left ventricle  demonstrates global hypokinesis. The left ventricular internal cavity size  was moderately dilated. Left  ventricular diastolic parameters are consistent with Grade II diastolic  dysfunction (pseudonormalization).   2. Right ventricular systolic function is normal. The right ventricular  size is normal.   3. Left atrial size was mildly dilated.   4. The mitral valve is normal in structure. Moderate mitral valve  regurgitation.   5. The  aortic valve is normal in structure. Aortic valve regurgitation is  not visualized.  __________   2D echo 09/18/2017: - Left ventricle: The cavity size was normal. Wall thickness was    increased in a pattern of mild LVH. Systolic function was normal.    The estimated ejection fraction was in the range of 60% to 65%.    Wall motion was normal; there were no regional wall motion    abnormalities. Doppler parameters are consistent with abnormal    left ventricular relaxation (grade 1 diastolic dysfunction).  - Mitral valve: There was mild regurgitation.  - Left atrium: The atrium was mildly dilated.  - Right ventricle: The cavity size was normal. Systolic function    was normal.  - Tricuspid valve: There was mild-moderate regurgitation.   Patient Profile     61 y.o. 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 Sidney Ace.  Assessment & Plan    1. Acute HFrEF: -Uncertain etiology, though cannot exclude ischemic given risk factors -Continue newly started carvedilol 3.125 mg twice daily and losartan 25 mg daily with recommendation to likely transition ARB to ARNI prior to discharge along with IV diuresis, with decreased dose of IV Lasix to 40 mg daily given up trending BUN -Continue escalation of GDMT throughout admission -Pursue [facility], likely 10/09/2022 for further evaluation of her cardiomyopathy and to further evaluate hemodynamic status -Pending cardiac cath, and following escalation of GDMT, would recommend repeat echo in the outpatient setting in several months time, if her EF remains less than 35% at that time would recommend referral to EP for consideration of ICD and to the advanced heart failure service, pending medical adherence -NPO at midnight -Daily weights -Strict I's and O's -CHF education   2.   AKI: -Up trending BUN, decrease IV Lasix to 40 mg daily -Monitor   3.  Normocytic anemia: -No obvious source of bleed -Management per primary service   4.  HTN: -Improved following medical therapy as outlined above   5.  HLD: -LDL 64 in 02/2022 -PTA atorvastatin   6.  Tobacco use: -Complete  cessation recommended   7.  DM2: -A1c 6.2 -Management per primary service     Shared Decision Making/Informed Consent{  The risks [stroke (1 in 1000), death (1 in 1000), kidney failure [usually temporary] (1 in 500), bleeding (1 in 200), allergic reaction [possibly serious] (1 in 200)], benefits (diagnostic support and management of coronary artery disease) and alternatives of a cardiac catheterization were discussed in detail with Jennifer Weiss and she is willing to proceed.   For questions or updates, please contact [place] Please consult [email or web address] for contact info under Cardiology/STEMI.    Signed, Christell Faith, PA-C [place] Pager: [phone] 10/08/2022, 8:22 AM

## 2022-10-09 ENCOUNTER — Encounter
Admission: EM | Disposition: A | Discharge status: Home / Self Care | Payer: Self-pay | Source: Home / Self Care | Attending: Student

## 2022-10-09 DIAGNOSIS — R0602 Shortness of breath: Secondary | ICD-10-CM

## 2022-10-09 DIAGNOSIS — I420 Dilated cardiomyopathy: Secondary | ICD-10-CM

## 2022-10-09 DIAGNOSIS — E118 Type 2 diabetes mellitus with unspecified complications: Secondary | ICD-10-CM

## 2022-10-09 DIAGNOSIS — I5041 Acute combined systolic (congestive) and diastolic (congestive) heart failure: Secondary | ICD-10-CM | POA: Diagnosis not present

## 2022-10-09 HISTORY — PX: RIGHT/LEFT HEART CATH AND CORONARY ANGIOGRAPHY: CATH118266

## 2022-10-09 LAB — POCT I-STAT EG7
Acid-Base Excess: 5 mmol/L — ABNORMAL HIGH (ref 0.0–2.0)
Bicarbonate: 31.4 mmol/L — ABNORMAL HIGH (ref 20.0–28.0)
Calcium, Ion: 1.19 mmol/L (ref 1.15–1.40)
HCT: 33 % — ABNORMAL LOW (ref 36.0–46.0)
Hemoglobin: 11.2 g/dL — ABNORMAL LOW (ref 12.0–15.0)
O2 Saturation: 57 %
Potassium: 4.2 mmol/L (ref 3.5–5.1)
Sodium: 140 mmol/L (ref 135–145)
TCO2: 33 mmol/L — ABNORMAL HIGH (ref 22–32)
pCO2, Ven: 53.4 mmHg (ref 44–60)
pH, Ven: 7.378 (ref 7.25–7.43)
pO2, Ven: 31 mmHg — CL (ref 32–45)

## 2022-10-09 LAB — POCT I-STAT 7, (LYTES, BLD GAS, ICA,H+H)
Acid-Base Excess: 4 mmol/L — ABNORMAL HIGH (ref 0.0–2.0)
Bicarbonate: 29.4 mmol/L — ABNORMAL HIGH (ref 20.0–28.0)
Calcium, Ion: 1.2 mmol/L (ref 1.15–1.40)
HCT: 33 % — ABNORMAL LOW (ref 36.0–46.0)
Hemoglobin: 11.2 g/dL — ABNORMAL LOW (ref 12.0–15.0)
O2 Saturation: 88 %
Potassium: 4.1 mmol/L (ref 3.5–5.1)
Sodium: 140 mmol/L (ref 135–145)
TCO2: 31 mmol/L (ref 22–32)
pCO2 arterial: 47.5 mmHg (ref 32–48)
pH, Arterial: 7.4 (ref 7.35–7.45)
pO2, Arterial: 56 mmHg — ABNORMAL LOW (ref 83–108)

## 2022-10-09 LAB — CBC
HCT: 34.9 % — ABNORMAL LOW (ref 36.0–46.0)
Hemoglobin: 10.4 g/dL — ABNORMAL LOW (ref 12.0–15.0)
MCH: 27.1 pg (ref 26.0–34.0)
MCHC: 29.8 g/dL — ABNORMAL LOW (ref 30.0–36.0)
MCV: 90.9 fL (ref 80.0–100.0)
Platelets: 232 10*3/uL (ref 150–400)
RBC: 3.84 MIL/uL — ABNORMAL LOW (ref 3.87–5.11)
RDW: 17.2 % — ABNORMAL HIGH (ref 11.5–15.5)
WBC: 9.8 10*3/uL (ref 4.0–10.5)
nRBC: 0 % (ref 0.0–0.2)

## 2022-10-09 LAB — BASIC METABOLIC PANEL
Anion gap: 6 (ref 5–15)
BUN: 28 mg/dL — ABNORMAL HIGH (ref 8–23)
CO2: 30 mmol/L (ref 22–32)
Calcium: 8.5 mg/dL — ABNORMAL LOW (ref 8.9–10.3)
Chloride: 104 mmol/L (ref 98–111)
Creatinine, Ser: 1.32 mg/dL — ABNORMAL HIGH (ref 0.44–1.00)
GFR, Estimated: 46 mL/min — ABNORMAL LOW (ref >60)
Glucose, Bld: 107 mg/dL — ABNORMAL HIGH (ref 70–99)
Potassium: 4.1 mmol/L (ref 3.5–5.1)
Sodium: 140 mmol/L (ref 135–145)

## 2022-10-09 LAB — GLUCOSE, CAPILLARY
Glucose-Capillary: 76 mg/dL (ref 70–99)
Glucose-Capillary: 108 mg/dL — ABNORMAL HIGH (ref 70–99)
Glucose-Capillary: 88 mg/dL (ref 70–99)
Glucose-Capillary: 108 mg/dL — ABNORMAL HIGH (ref 70–99)
Glucose-Capillary: 114 mg/dL — ABNORMAL HIGH (ref 70–99)

## 2022-10-09 SURGERY — RIGHT/LEFT HEART CATH AND CORONARY ANGIOGRAPHY
Anesthesia: Moderate Sedation

## 2022-10-09 MED ORDER — MIDAZOLAM HCL 2 MG/2ML IJ SOLN
INTRAMUSCULAR | Status: DC | PRN
  Administered 2022-10-09: .5 mg via INTRAVENOUS

## 2022-10-09 MED ORDER — LABETALOL HCL 5 MG/ML IV SOLN: 10.0000 mg | INTRAVENOUS | Status: AC | PRN

## 2022-10-09 MED ORDER — LIDOCAINE HCL 1 % IJ SOLN
INTRAMUSCULAR | Status: AC
  Filled 2022-10-09: qty 20

## 2022-10-09 MED ORDER — HEPARIN SODIUM (PORCINE) 1000 UNIT/ML IJ SOLN
INTRAMUSCULAR | Status: AC
  Filled 2022-10-09: qty 10

## 2022-10-09 MED ORDER — SODIUM CHLORIDE 0.9% FLUSH: 3.0000 mL | INTRAVENOUS | Status: DC | PRN

## 2022-10-09 MED ORDER — MIDAZOLAM HCL 2 MG/2ML IJ SOLN
INTRAMUSCULAR | Status: AC
  Filled 2022-10-09: qty 2

## 2022-10-09 MED ORDER — HEPARIN (PORCINE) IN NACL 1000-0.9 UT/500ML-% IV SOLN
INTRAVENOUS | Status: AC
  Filled 2022-10-09: qty 1000

## 2022-10-09 MED ORDER — IOHEXOL 300 MG/ML  SOLN
INTRAMUSCULAR | Status: DC | PRN
  Administered 2022-10-09: 28 mL

## 2022-10-09 MED ORDER — FENTANYL CITRATE (PF) 100 MCG/2ML IJ SOLN
INTRAMUSCULAR | Status: AC
  Filled 2022-10-09: qty 2

## 2022-10-09 MED ORDER — VERAPAMIL HCL 2.5 MG/ML IV SOLN
INTRAVENOUS | Status: AC
  Filled 2022-10-09: qty 2

## 2022-10-09 MED ORDER — VERAPAMIL HCL 2.5 MG/ML IV SOLN
INTRAVENOUS | Status: DC | PRN
  Administered 2022-10-09: 2.5 mg via INTRA_ARTERIAL

## 2022-10-09 MED ORDER — LIDOCAINE HCL (PF) 1 % IJ SOLN
INTRAMUSCULAR | Status: DC | PRN
  Administered 2022-10-09: 4 mL

## 2022-10-09 MED ORDER — HEPARIN SODIUM (PORCINE) 1000 UNIT/ML IJ SOLN
INTRAMUSCULAR | Status: DC | PRN
  Administered 2022-10-09: 5000 [IU] via INTRAVENOUS

## 2022-10-09 MED ORDER — SODIUM CHLORIDE 0.9% FLUSH
3.0000 mL | Freq: Two times a day (BID) | INTRAVENOUS | Status: DC
  Administered 2022-10-09 – 2022-10-11 (×4): 3 mL via INTRAVENOUS

## 2022-10-09 MED ORDER — FENTANYL CITRATE (PF) 100 MCG/2ML IJ SOLN
INTRAMUSCULAR | Status: DC | PRN
  Administered 2022-10-09: 25 ug via INTRAVENOUS

## 2022-10-09 MED ORDER — SODIUM CHLORIDE 0.9 % IV SOLN: INTRAVENOUS | Status: AC

## 2022-10-09 MED ORDER — HYDRALAZINE HCL 20 MG/ML IJ SOLN: 10.0000 mg | INTRAMUSCULAR | Status: AC | PRN

## 2022-10-09 MED ORDER — SODIUM CHLORIDE 0.9 % IV SOLN: 250.0000 mL | INTRAVENOUS | Status: DC | PRN

## 2022-10-09 MED ORDER — HEPARIN (PORCINE) IN NACL 1000-0.9 UT/500ML-% IV SOLN
INTRAVENOUS | Status: DC | PRN
  Administered 2022-10-09 (×2): 500 mL

## 2022-10-09 SURGICAL SUPPLY — 14 items
BAND ZEPHYR COMPRESS 30 LONG (HEMOSTASIS) IMPLANT
CANNULA 5F STIFF (CANNULA) IMPLANT
CATH 5F 110X4 TIG (CATHETERS) IMPLANT
CATH BALLN WEDGE 5F 110CM (CATHETERS) IMPLANT
DRAPE BRACHIAL (DRAPES) IMPLANT
GLIDESHEATH SLEND SS 6F .021 (SHEATH) IMPLANT
GUIDEWIRE INQWIRE 1.5J.035X260 (WIRE) IMPLANT
INQWIRE 1.5J .035X260CM (WIRE) ×1
PACK CARDIAC CATH (CUSTOM PROCEDURE TRAY) ×1 IMPLANT
PANNUS RETENTION SYSTEM 2 PAD (MISCELLANEOUS) IMPLANT
PROTECTION STATION PRESSURIZED (MISCELLANEOUS) ×1
SET ATX SIMPLICITY (MISCELLANEOUS) IMPLANT
SHEATH GLIDE SLENDER 4/5FR (SHEATH) IMPLANT
STATION PROTECTION PRESSURIZED (MISCELLANEOUS) IMPLANT

## 2022-10-09 NOTE — Interval H&P Note (Signed)
History and Physical Interval Note:  10/09/2022 12:33 PM  Jennifer Weiss  has presented today for surgery, with the diagnosis of acute heart failure with reduced ejection fraction - dilated cardiomyopathy.  The various methods of treatment have been discussed with the patient and family. After consideration of risks, benefits and other options for treatment, the patient has consented to  Procedure(s): RIGHT/LEFT HEART CATH AND CORONARY ANGIOGRAPHY (N/A)  PERCUTANEOUS CORONARY INTERVENTION  as a surgical intervention.  The patient's history has been reviewed, patient examined, no change in status, stable for surgery.  I have reviewed the patient's chart and labs.  Questions were answered to the patient's satisfaction.    Cath Lab Visit (complete for each Cath Lab visit)  Clinical Evaluation Leading to the Procedure:   ACS: No.  Non-ACS:    Anginal Classification: CCS III - NYHA CHF Sx  Anti-ischemic medical therapy: Minimal Therapy (1 class of medications)  Non-Invasive Test Results: High-risk stress test findings: cardiac mortality >3%/year; EF 20-25%  Prior CABG: No previous CABG     Glenetta Hew

## 2022-10-09 NOTE — Care Management Important Message (Signed)
Important Message  Patient Details  Name: Jennifer Weiss MRN: [id number] Date of Birth: 01/28/1961   Medicare Important Message Given:  Yes  Out of room upon time of visit.  Copy of Medicare IM left in room for reference.   Dannette Barbara 10/09/2022, 3:49 PM

## 2022-10-09 NOTE — Brief Op Note (Addendum)
BRIEF RIGHT & LEFT HEART CATHETERIZATION  PATIENT: Jennifer Weiss    MRN: [id number]  10/09/2022 1:27 PM  [facility] Hospitalist: Lorella Nimrod, MD Cardiology Team: Kate Sable, MD, Ida Rogue, MD; Christell Faith, PA, Cadence Sasser, [place]  SURGEON:  Surgeon(s) and Role: Leonie Man, MD - Primary  PROCEDURE:  Procedure(s): RIGHT/LEFT HEART CATH AND CORONARY ANGIOGRAPHY (N/A)  PATIENT:  Jennifer Weiss is a 61 y.o. 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, syncope, DM2 with peripheral neuropathy, HTN, HLD, COPD, tobacco use, and obesity who is being evaluated today new diagnosis of HFrEF with acute combined systolic and diastolic heart failure.  She has been seen by [facility], and referred for invasive evaluation with right and left heart catheterization.   PRE-OPERATIVE DIAGNOSIS:  acute heart failure with reduced ejection fraction  POST-OPERATIVE DIAGNOSIS:   Angiographically normal coronary arteries with essentially dominant wraparound LAD the provides flow to at least the distal two thirds of the PDA distribution, circumflex is only a AV groove circumflex to the posterior lateral system and the RCA is borderline codominant with a very small PDA that appears to come off of a marginal branch as well as a posterolateral branch coming off the main triple-vessel. AOP-MAP 116/74 mmHg - 90 mmHg; LV PF and EDP 118/15 mmHg - 22 mmHg RHC numbers: RAP mean 11 million mercury, RVP-EDP 43/8-16 mmHg; PAP-mean 45/21-28 mmHg.  PCWP 19-20 mmHg; Ao sat 88%, PA sat 57% CO-CI (Fick) 5.96-2.82 -> consistent with mild secondary pulmonary hypertension/pulmonary venous congestion due to elevated LVEDP and PCWP; reduced cardiac index   PROCEDURE PERFORMED Time Out: Verified patient identification, verified procedure, site/side was marked, verified correct patient position, special equipment/implants available, medications/allergies/relevent history reviewed, required  imaging and test results available. Performed.  Access:  Right Radial Artery: 6 Fr sheath -- Seldinger technique using Micropuncture Kit Direct ultrasound guidance used.  Permanent image obtained and placed on chart. 10 mL radial cocktail IA; 6000 units IV Heparin Right Brachial/Antecubital Vein: The existing 18-gauge IV was exchanged over a wire for a 5Fr Glide sheath Direct ultrasound guidance used.  Permanent image obtained and placed on chart.  Right Heart Catheterization: 5 Fr Gordy Councilman catheter advanced under fluoroscopy with balloon inflated to the RA, RV, then PCWP-PA for hemodynamic measurement.  Simultaneous FA & PA blood gases checked for SaO2% to calculate FICK CO/CI  Thermodilution Injections performed to calculate CO/CI  Catheter removed completely out of the body with balloon deflated.  Left Heart Catheterization: 5Fr TIG 4.0 Catheter was advanced over a long J-wire under direct fluoroscopic guidance into the ascending aorta; the catheter was advanced across the Ao Valve for measurement of hemodynamics first.  The catheter was then pullback across the aortic valve for measurement of pullback gradient.  It was then directed first into the left than the right coronary artery for selective coronary cineangiography.  Upon completion of Angiogaphy, the catheter was removed completely out of the body over a wire, without complication.  Brachial Sheath(s) removed in the Cardiac Catheterization Lab with manual pressure for hemostasis.    Radial sheath removed in the Cardiac Catheterization Lab with Zephyr band placed for hemostasis.  TR Band: 1330  Hours; 13 mL air  MEDICATIONS SQ Lidocaine 44m for both brachial and radial access. Radial Cocktail: 3 mg Verapmil in 10 mL NS Isovue Contrast: 28 mL Heparin: 6000 units Contrast 28 mL  ANESTHESIA:   local and IV sedation; 3 mL subcu lidocaine for each brachial and radial  access.  0.5 mg Versed, 12.5 mg fentanyl.  EBL:  Minimal  COUNTS:  YES  DICTATION: .Note written in [place]:  Return to nursing floor for ongoing care per cardiology consult service  PATIENT DISPOSITION:  PACU - hemodynamically stable.   Delay start of Pharmacological VTE agent (>24hrs) due to surgical blood loss or risk of bleeding: not applicable   Glenetta Hew, MD

## 2022-10-09 NOTE — H&P (View-Only) (Signed)
Rounding Note    Patient Name: Rowen Hur Date of Encounter: 10/09/2022  [place] Cardiologist: New  Subjective   Patient denies chest pain or SOB. Plan for [facility] today. UOP -2.4L.   Inpatient Medications    Scheduled Meds:  acyclovir  800 mg Oral Daily   aspirin EC  81 mg Oral Daily   atorvastatin  40 mg Oral Daily   carvedilol  3.125 mg Oral BID WC   enoxaparin (LOVENOX) injection  40 mg Subcutaneous Q24H   furosemide  40 mg Intravenous Daily   gabapentin  300 mg Oral TID   insulin aspart  0-5 Units Subcutaneous QHS   insulin aspart  0-9 Units Subcutaneous TID WC   insulin glargine-yfgn  5 Units Subcutaneous BID   ipratropium  0.5 mg Inhalation BID   losartan  25 mg Oral Daily   sodium chloride flush  3 mL Intravenous Q12H   Continuous Infusions:  sodium chloride Stopped (10/09/22 0552)   sodium chloride 10 mL/hr at 10/09/22 0551   PRN Meds: sodium chloride, acetaminophen **OR** acetaminophen, albuterol, ipratropium-albuterol, magnesium hydroxide, ondansetron **OR** ondansetron (ZOFRAN) IV, sodium chloride flush, traZODone   Vital Signs    Vitals:   10/09/22 0019 10/09/22 0304 10/09/22 0405 10/09/22 0714  BP: 138/81 125/84  127/87  Pulse: 90 86  82  Resp: '16 16  18  '$ Temp: 98 F (36.7 C) 98.1 F (36.7 C)  98.2 F (36.8 C)  TempSrc: Oral Oral  Oral  SpO2: 99% 100%  96%  Weight:   106.3 kg   Height:        Intake/Output Summary (Last 24 hours) at 10/09/2022 0824 Last data filed at 10/09/2022 4765 Gross per 24 hour  Intake --  Output 2400 ml  Net -2400 ml      10/09/2022    4:05 AM 10/08/2022    9:57 AM 10/07/2022    6:19 AM  Last 3 Weights  Weight (lbs) 234 lb 5.6 oz 229 lb 230 lb  Weight (kg) 106.3 kg 103.874 kg 104.327 kg      Telemetry    NSR, HR 100, PVCs - Personally Reviewed  ECG    No new - Personally Reviewed  Physical Exam   GEN: No acute distress.   Neck: No JVD Cardiac: RRR, no murmurs, rubs, or gallops.   Respiratory: Clear to auscultation bilaterally. GI: Soft, nontender, non-distended  MS: No edema; No deformity. Neuro:  Nonfocal  Psych: Normal affect   Labs    High Sensitivity Troponin:   Recent Labs  Lab 10/06/22 1847  TROPONINIHS 11     Chemistry Recent Labs  Lab 10/06/22 1847 10/06/22 1927 10/06/22 2346 10/07/22 0535 10/08/22 0409 10/09/22 0517  NA  --    < >  --  138 141 140  K  --    < >  --  4.0 3.6 4.1  CL  --    < >  --  106 104 104  CO2  --    < >  --  22 32 30  GLUCOSE  --    < >  --  265* 174* 107*  BUN  --    < >  --  16 24* 28*  CREATININE  --    < >  --  1.27* 1.26* 1.32*  CALCIUM  --    < >  --  8.8* 9.1 8.5*  MG  --   --  1.9  --   --   --  PROT 7.4  --   --   --   --   --   ALBUMIN 3.5  --   --   --   --   --   AST 27  --   --   --   --   --   ALT 22  --   --   --   --   --   ALKPHOS 110  --   --   --   --   --   BILITOT 0.8  --   --   --   --   --   GFRNONAA  --    < >  --  48* 49* 46*  ANIONGAP  --    < >  --  '10 5 6   '$ < > = values in this interval not displayed.    Lipids No results for input(s): "CHOL", "TRIG", "HDL", "LABVLDL", "LDLCALC", "CHOLHDL" in the last 168 hours.  Hematology Recent Labs  Lab 10/06/22 1843 10/07/22 0535  WBC 7.4 6.2  RBC 3.54* 3.79*  HGB 9.6* 10.5*  HCT 32.6* 34.5*  MCV 92.1 91.0  MCH 27.1 27.7  MCHC 29.4* 30.4  RDW 17.0* 17.2*  PLT 176 194   Thyroid  Recent Labs  Lab 10/06/22 2346  TSH 0.433    BNP Recent Labs  Lab 10/06/22 1847  BNP 719.8*    DDimer No results for input(s): "DDIMER" in the last 168 hours.   Radiology    ECHOCARDIOGRAM COMPLETE  Result Date: 10/07/2022    ECHOCARDIOGRAM REPORT   Patient Name:   Ruthell Peplinski Date of Exam: 10/07/2022 Medical Rec #:  [id number]    Height:       65.0 in Accession #:    [id number]   Weight:       230.0 lb Date of Birth:  1961/08/27   BSA:          2.099 m Patient Age:    29 years     BP:           131/78 mmHg Patient Gender: F            HR:            89 bpm. Exam Location:  [facility] Procedure: 2D Echo, Cardiac Doppler and Color Doppler Indications:     I50.31 CHF Acute Diastolic  History:         Patient has prior history of Echocardiogram examinations, most                  recent 09/18/2017. COPD; Risk Factors:Current Smoker, Diabetes,                  Hypertension and Dyslipidemia.  Sonographer:     Rosalia Hammers Referring Phys:  [id number] [place] Diagnosing Phys: Serafina Royals MD IMPRESSIONS  1. Left ventricular ejection fraction, by estimation, is 25 to 30%. The left ventricle has severely decreased function. The left ventricle demonstrates global hypokinesis. The left ventricular internal cavity size was moderately dilated. Left ventricular diastolic parameters are consistent with Grade II diastolic dysfunction (pseudonormalization).  2. Right ventricular systolic function is normal. The right ventricular size is normal.  3. Left atrial size was mildly dilated.  4. The mitral valve is normal in structure. Moderate mitral valve regurgitation.  5. The aortic valve is normal in structure. Aortic valve regurgitation is not visualized. FINDINGS  Left Ventricle: Left ventricular ejection fraction, by estimation, is 25 to  30%. The left ventricle has severely decreased function. The left ventricle demonstrates global hypokinesis. The left ventricular internal cavity size was moderately dilated. There is borderline left ventricular hypertrophy. Left ventricular diastolic parameters are consistent with Grade II diastolic dysfunction (pseudonormalization). Right Ventricle: The right ventricular size is normal. No increase in right ventricular wall thickness. Right ventricular systolic function is normal. Left Atrium: Left atrial size was mildly dilated. Right Atrium: Right atrial size was normal in size. Pericardium: There is no evidence of pericardial effusion. Mitral Valve: The mitral valve is normal in structure. Moderate mitral valve regurgitation. Tricuspid  Valve: The tricuspid valve is normal in structure. Tricuspid valve regurgitation is trivial. Aortic Valve: The aortic valve is normal in structure. Aortic valve regurgitation is not visualized. Pulmonic Valve: The pulmonic valve was normal in structure. Pulmonic valve regurgitation is trivial. Aorta: The aortic root and ascending aorta are structurally normal, with no evidence of dilitation. IAS/Shunts: No atrial level shunt detected by color flow Doppler.   LV Volumes (MOD) LV vol d, MOD A2C: 190.0 ml LV vol d, MOD A4C: 217.0 ml LV vol s, MOD A2C: 147.0 ml LV vol s, MOD A4C: 157.0 ml LV SV MOD A2C:     43.0 ml LV SV MOD A4C:     217.0 ml LV SV MOD BP:      46.8 ml Serafina Royals MD Electronically signed by Serafina Royals MD Signature Date/Time: 10/07/2022/12:18:57 PM    Final     Cardiac Studies   Expand All Collapse All     Progress Note   Patient Name: Dyneshia Baccam Date of Encounter: 10/08/2022   Primary Cardiologist: New - consult by End   Subjective    Dyspnea improving. Chest pain associated with cough. Documented UOP 1.4 L for the past 24 hours, net - 3.3 L for the admission. No weight. BUN trending up, stable SCr.   Inpatient Medications    Scheduled Meds:  acyclovir  800 mg Oral Daily   aspirin EC  81 mg Oral Daily   atorvastatin  40 mg Oral Daily   carvedilol  3.125 mg Oral BID WC   enoxaparin (LOVENOX) injection  40 mg Subcutaneous Q24H   furosemide  40 mg Intravenous BID   gabapentin  300 mg Oral TID   insulin aspart  0-5 Units Subcutaneous QHS   insulin aspart  0-9 Units Subcutaneous TID WC   insulin glargine-yfgn  5 Units Subcutaneous BID   ipratropium  0.5 mg Inhalation BID   losartan  25 mg Oral Daily    Continuous Infusions:   PRN Meds: acetaminophen **OR** acetaminophen, albuterol, ipratropium-albuterol, magnesium hydroxide, ondansetron **OR** ondansetron (ZOFRAN) IV, traZODone    Vital Signs          Vitals:    10/07/22 2000 10/07/22 2049 10/07/22 2354  10/08/22 0308  BP: 130/73   135/89 125/80  Pulse: 90   92 83  Resp: '20   16 16  '$ Temp: 98.1 F (36.7 C)   97.9 F (36.6 C) 97.7 F (36.5 C)  TempSrc:     Oral Oral  SpO2: 95% 96% 94% 94%  Weight:          Height:              Intake/Output Summary (Last 24 hours) at 10/08/2022 2426 Last data filed at 10/08/2022 0300    Gross per 24 hour  Intake 240 ml  Output 1700 ml  Net -1460 ml  Filed Weights    10/06/22 1840 10/07/22 0619  Weight: 94.8 kg 104.3 kg      Telemetry    SR with sinus tachycardia, 80s to low 100s bpm, rare PVCs - Personally Reviewed   ECG    No new tracings - Personally Reviewed   Physical Exam    GEN: No acute distress.   Neck: JVD elevated ~ 8 cm. Cardiac: RRR, II/VI systolic murmur LSB, no rubs, or gallops.  Respiratory: Mildly diminished bilaterally.  GI: Soft, nontender, non-distended.   MS: No edema; No deformity. Neuro:  Alert and oriented x 3; Nonfocal.  Psych: Normal affect.   Labs    Chemistry Last Labs        Recent Labs  Lab 10/06/22 1847 10/06/22 1927 10/07/22 0535 10/08/22 0409  NA  --  140 138 141  K  --  3.6 4.0 3.6  CL  --  107 106 104  CO2  --  23 22 32  GLUCOSE  --  117* 265* 174*  BUN  --  16 16 24*  CREATININE  --  1.33* 1.27* 1.26*  CALCIUM  --  8.9 8.8* 9.1  PROT 7.4  --   --   --   ALBUMIN 3.5  --   --   --   AST 27  --   --   --   ALT 22  --   --   --   ALKPHOS 110  --   --   --   BILITOT 0.8  --   --   --   GFRNONAA  --  46* 48* 49*  ANIONGAP  --  '10 10 5        '$ Hematology Last Labs      Recent Labs  Lab 10/06/22 1843 10/07/22 0535  WBC 7.4 6.2  RBC 3.54* 3.79*  HGB 9.6* 10.5*  HCT 32.6* 34.5*  MCV 92.1 91.0  MCH 27.1 27.7  MCHC 29.4* 30.4  RDW 17.0* 17.2*  PLT 176 194        Cardiac Enzymes Last Labs  No results for input(s): "TROPONINI" in the last 168 hours.    Last Labs  No results for input(s): "TROPIPOC" in the last 168 hours.      BNP Last Labs     Recent  Labs  Lab 10/06/22 1847  BNP 719.8*        DDimer  Last Labs  No results for input(s): "DDIMER" in the last 168 hours.      Radiology    DG Chest 1 View   Result Date: 10/06/2022 IMPRESSION: Pulmonary edema with trace left pleural effusion. Superimposed infection not excluded. Electronically Signed   By: Iven Finn M.D.   On: 10/06/2022 19:31     Cardiac Studies    2D echo 10/07/2022: 1. Left ventricular ejection fraction, by estimation, is 25 to 30%. The  left ventricle has severely decreased function. The left ventricle  demonstrates global hypokinesis. The left ventricular internal cavity size  was moderately dilated. Left  ventricular diastolic parameters are consistent with Grade II diastolic  dysfunction (pseudonormalization).   2. Right ventricular systolic function is normal. The right ventricular  size is normal.   3. Left atrial size was mildly dilated.   4. The mitral valve is normal in structure. Moderate mitral valve  regurgitation.   5. The aortic valve is normal in structure. Aortic valve regurgitation is  not visualized.  __________   2D echo 09/18/2017: -  Left ventricle: The cavity size was normal. Wall thickness was    increased in a pattern of mild LVH. Systolic function was normal.    The estimated ejection fraction was in the range of 60% to 65%.    Wall motion was normal; there were no regional wall motion    abnormalities. Doppler parameters are consistent with abnormal    left ventricular relaxation (grade 1 diastolic dysfunction).  - Mitral valve: There was mild regurgitation.  - Left atrium: The atrium was mildly dilated.  - Right ventricle: The cavity size was normal. Systolic function    was normal.  - Tricuspid valve: There was mild-moderate regurgitation.       Patient Profile     61 y.o. 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, syncope, DM2 with peripheral neuropathy, HTN, HLD, COPD, tobacco  use, and obesity who is being seen today for the evaluation of HFrEF   Assessment & Plan    Acute HFrEF - uncertain etiology, cannot exclude ischemia - continue Coreg 3.'125mg'$ BID and Losartan '25mg'$  daily - IV lasix '40mg'$  daily>will hold for minimally elevated Scr/BUN and heart cath today - Net -5.7L - does not appear significantly volume on exam - plan for cardiac cath today - continue GDMT as able  AKI - IV lasix decreased to '40mg'$  daily - AM labs with mildly increased Sr/BUN - will hold IV lasix for heart cath  HLD - LDL 64 02/2022 - continue PTA Atorvastatin  DM2 - A1C 6.2 - per IM  For questions or updates, please contact [place] Please consult [email or web address] for contact info under        Signed, Marquelle Musgrave Ninfa Meeker, PA-C  10/09/2022, 8:24 AM

## 2022-10-09 NOTE — Progress Notes (Signed)
[facility] Progress Note  Patient: Jennifer Weiss    QQI:[id number]  DOA: 10/06/2022     Date of Service: the patient was seen and examined on 10/09/2022  Chief Complaint  Patient presents with   Shortness of Breath   Brief hospital course: Taken from H&P.   Jennifer Weiss is a 61 y.o. African-American female with medical history significant for asthma type 2 diabetes mellitus, hypertension, seizure disorder, carotid artery disease, who presented to the ER with acute worsening dyspnea for 1 day. Patient has exertional dyspnea for the past 2 to 61-month  Also complaining of mild cough productive of yellowish sputum.  She admits having some wheezing and subjective fever and chills, no documentation.  No chest pain or palpitations.  She has some orthopnea and PND along with worsening lower extremity edema.   ED course.  On arrival blood pressure was 151/90, heart rate 103, sat sitting well on room air.  Labs with creatinine 1.33 with baseline below 1 and BNP of 719.8. EKG with sinus tachycardia, Q waves in inferior leads. Chest x-ray with pulmonary edema and trace left pleural effusion.   Patient was given 40 mg of IV Lasix and started on IV diuresis Cardiology was consulted.   10/24: Creatinine improved to 1.27, TSH within normal limit at 0.433.  COVID PCR negative.  Troponin negative. Echocardiogram with new diagnosis of HFrEF with EF of 25 to 30% and severely depressed LV function, global hypokinesis, grade 2 diastolic dysfunction, moderately dilated left side with mitral valve regurgitation. Noted to be on acyclovir 800 mg twice daily-Per patient she is taking it for many years for genital herpes.  Do not see any history of immunocompromised status, ask pharmacy to verify.   10/25: Shortness of breath improving.  Creatinine seems stable at 1.26, IV Lasix dose was decreased to daily due to worsening BUN.  Cardiac catheterization tomorrow  10/26 patient denies any symptoms, scheduled  for cardiac cath today. Cr 1.19 today   Assessment and Plan:  # Acute HFrEF (heart failure with reduced ejection fraction) (HCC) Echocardiogram with new diagnosis of reduced EF of 20 to 25% and grade 2 diastolic dysfunction. Prior echocardiogram with normal EF and diastolic dysfunction. Cardiology consulted-cardiac catheterization today -s/p IV Lasix-DC'd on 10/26 due to cardiac cath -Daily weight and BMP -Strict intake and output   # Essential hypertension Blood pressure within goal -Continue home amlodipine. -s/p  IV Lasix, had on 10/26 due to cardiac cath -Keep holding Cozaar for AKI   Type 2 diabetes mellitus with peripheral neuropathy (HCC) CBG within goal today.  A1c of 6.2 -Continue Semglee 5 units twice daily -Continue with SSI   Anxiety and depression - continue  SSRI.   Dyslipidemia - continue statin therapy.   Chronic obstructive pulmonary disease (COPD) (HCC) - continue inhalers while holding off long-acting beta agonist in the setting of acute CHF.   Obesity (BMI 30-39.9) Estimated body mass index is 38.27 kg/m as calculated from the following:   Height as of this encounter: '5\' 5"'$  (1.651 m).   Weight as of this encounter: 104.3 kg.    -This will complicate overall prognosis -Encourage weight loss   Tobacco abuse - Counseling was provided -Nicotine patch as needed.   Body mass index is 39 kg/m.  Interventions:      Diet: N.p.o. for cardiac cath DVT Prophylaxis: Subcutaneous Lovenox   Advance goals of care discussion: Full code  Family Communication: family was NOT present at bedside, at the time of interview.  The pt provided permission to discuss medical plan with the family. Opportunity was given to ask question and all questions were answered satisfactorily.   Disposition:  Pt is from home, admitted with new onset CHF, scheduled for cardiac catheter today, which precludes a safe discharge. Discharge to Home, when stable and cleared by  cardiology..  Subjective: No significant overnight events.  Patient denies any worsening of shortness of breath, no chest pain or palpitation, no any other active issues.  Bilateral lower extremity edema resolved. Patient is aware that she is scheduled for cardiac catheter today  Physical Exam: General:  alert oriented to time, place, and person.  Appear in no distress, affect appropriate Eyes: PERRLA ENT: Oral Mucosa Clear, moist  Neck: no JVD,  Cardiovascular: S1 and S2 Present, no Murmur,  Respiratory: good respiratory effort, Bilateral Air entry equal and Decreased, mild Crackles, no wheezes Abdomen: Bowel Sound present, Soft and no tenderness,  Skin: no rashes Extremities: no Pedal edema, no calf tenderness Neurologic: without any new focal findings Gait not checked due to patient safety concerns  Vitals:   10/09/22 1400 10/09/22 1415 10/09/22 1430 10/09/22 1500  BP: (!) 116/58 113/65 108/71 106/62  Pulse: 83 91 91 94  Resp: (!) 24 (!) 24 (!) 21 (!) 25  Temp:      TempSrc:      SpO2: 91% 91% 91% 91%  Weight:      Height:        Intake/Output Summary (Last 24 hours) at 10/09/2022 1529 Last data filed at 10/09/2022 1127 Gross per 24 hour  Intake --  Output 1900 ml  Net -1900 ml   Filed Weights   10/07/22 0619 10/08/22 0957 10/09/22 0405  Weight: 104.3 kg 103.9 kg 106.3 kg    Data Reviewed: I have personally reviewed and interpreted daily labs, tele strips, imagings as discussed above. I reviewed all nursing notes, pharmacy notes, vitals, pertinent old records I have discussed plan of care as described above with RN and patient/family.  CBC: Recent Labs  Lab 10/06/22 1843 10/07/22 0535 10/09/22 1306 10/09/22 1310  WBC 7.4 6.2  --   --   HGB 9.6* 10.5* 11.2* 11.2*  HCT 32.6* 34.5* 33.0* 33.0*  MCV 92.1 91.0  --   --   PLT 176 194  --   --    Basic Metabolic Panel: Recent Labs  Lab 10/06/22 1927 10/06/22 2346 10/07/22 0535 10/08/22 0409  10/09/22 0517 10/09/22 1306 10/09/22 1310  NA 140  --  138 141 140 140 140  K 3.6  --  4.0 3.6 4.1 4.1 4.2  CL 107  --  106 104 104  --   --   CO2 23  --  22 32 30  --   --   GLUCOSE 117*  --  265* 174* 107*  --   --   BUN 16  --  16 24* 28*  --   --   CREATININE 1.33*  --  1.27* 1.26* 1.32*  --   --   CALCIUM 8.9  --  8.8* 9.1 8.5*  --   --   MG  --  1.9  --   --   --   --   --     Studies: No results found.  Scheduled Meds:  [MAR Hold] acyclovir  800 mg Oral Daily   [MAR Hold] aspirin EC  81 mg Oral Daily   [MAR Hold] atorvastatin  40 mg Oral Daily   [MAR  Hold] carvedilol  3.125 mg Oral BID WC   [MAR Hold] enoxaparin (LOVENOX) injection  40 mg Subcutaneous Q24H   [MAR Hold] gabapentin  300 mg Oral TID   [MAR Hold] insulin aspart  0-5 Units Subcutaneous QHS   [MAR Hold] insulin aspart  0-9 Units Subcutaneous TID WC   [MAR Hold] insulin glargine-yfgn  5 Units Subcutaneous BID   [MAR Hold] ipratropium  0.5 mg Inhalation BID   [MAR Hold] losartan  25 mg Oral Daily   sodium chloride flush  3 mL Intravenous Q12H   Continuous Infusions:  sodium chloride 75 mL/hr at 10/09/22 1352   sodium chloride     PRN Meds: sodium chloride, [MAR Hold] acetaminophen **OR** [MAR Hold] acetaminophen, [MAR Hold] albuterol, hydrALAZINE, [MAR Hold] ipratropium-albuterol, labetalol, [MAR Hold] magnesium hydroxide, [MAR Hold] ondansetron **OR** [MAR Hold] ondansetron (ZOFRAN) IV, sodium chloride flush, [MAR Hold] traZODone  Time spent: 35 minutes  Author: Val Riles. MD [facility] 10/09/2022 3:29 PM  To reach On-call, see care teams to locate the attending and reach out to them via [email or web address]. If 7PM-7AM, please contact night-coverage If you still have difficulty reaching the attending provider, please page the [facility] (Director on Call) for [facility] on amion for assistance.

## 2022-10-09 NOTE — Progress Notes (Signed)
Rounding Note    Patient Name: Jennifer Weiss Date of Encounter: 10/09/2022  [place] Cardiologist: New  Subjective   Patient denies chest pain or SOB. Plan for [facility] today. UOP -2.4L.   Inpatient Medications    Scheduled Meds:  acyclovir  800 mg Oral Daily   aspirin EC  81 mg Oral Daily   atorvastatin  40 mg Oral Daily   carvedilol  3.125 mg Oral BID WC   enoxaparin (LOVENOX) injection  40 mg Subcutaneous Q24H   furosemide  40 mg Intravenous Daily   gabapentin  300 mg Oral TID   insulin aspart  0-5 Units Subcutaneous QHS   insulin aspart  0-9 Units Subcutaneous TID WC   insulin glargine-yfgn  5 Units Subcutaneous BID   ipratropium  0.5 mg Inhalation BID   losartan  25 mg Oral Daily   sodium chloride flush  3 mL Intravenous Q12H   Continuous Infusions:  sodium chloride Stopped (10/09/22 0552)   sodium chloride 10 mL/hr at 10/09/22 0551   PRN Meds: sodium chloride, acetaminophen **OR** acetaminophen, albuterol, ipratropium-albuterol, magnesium hydroxide, ondansetron **OR** ondansetron (ZOFRAN) IV, sodium chloride flush, traZODone   Vital Signs    Vitals:   10/09/22 0019 10/09/22 0304 10/09/22 0405 10/09/22 0714  BP: 138/81 125/84  127/87  Pulse: 90 86  82  Resp: '16 16  18  '$ Temp: 98 F (36.7 C) 98.1 F (36.7 C)  98.2 F (36.8 C)  TempSrc: Oral Oral  Oral  SpO2: 99% 100%  96%  Weight:   106.3 kg   Height:        Intake/Output Summary (Last 24 hours) at 10/09/2022 0824 Last data filed at 10/09/2022 2831 Gross per 24 hour  Intake --  Output 2400 ml  Net -2400 ml      10/09/2022    4:05 AM 10/08/2022    9:57 AM 10/07/2022    6:19 AM  Last 3 Weights  Weight (lbs) 234 lb 5.6 oz 229 lb 230 lb  Weight (kg) 106.3 kg 103.874 kg 104.327 kg      Telemetry    NSR, HR 100, PVCs - Personally Reviewed  ECG    No new - Personally Reviewed  Physical Exam   GEN: No acute distress.   Neck: No JVD Cardiac: RRR, no murmurs, rubs, or gallops.   Respiratory: Clear to auscultation bilaterally. GI: Soft, nontender, non-distended  MS: No edema; No deformity. Neuro:  Nonfocal  Psych: Normal affect   Labs    High Sensitivity Troponin:   Recent Labs  Lab 10/06/22 1847  TROPONINIHS 11     Chemistry Recent Labs  Lab 10/06/22 1847 10/06/22 1927 10/06/22 2346 10/07/22 0535 10/08/22 0409 10/09/22 0517  NA  --    < >  --  138 141 140  K  --    < >  --  4.0 3.6 4.1  CL  --    < >  --  106 104 104  CO2  --    < >  --  22 32 30  GLUCOSE  --    < >  --  265* 174* 107*  BUN  --    < >  --  16 24* 28*  CREATININE  --    < >  --  1.27* 1.26* 1.32*  CALCIUM  --    < >  --  8.8* 9.1 8.5*  MG  --   --  1.9  --   --   --  PROT 7.4  --   --   --   --   --   ALBUMIN 3.5  --   --   --   --   --   AST 27  --   --   --   --   --   ALT 22  --   --   --   --   --   ALKPHOS 110  --   --   --   --   --   BILITOT 0.8  --   --   --   --   --   GFRNONAA  --    < >  --  48* 49* 46*  ANIONGAP  --    < >  --  '10 5 6   '$ < > = values in this interval not displayed.    Lipids No results for input(s): "CHOL", "TRIG", "HDL", "LABVLDL", "LDLCALC", "CHOLHDL" in the last 168 hours.  Hematology Recent Labs  Lab 10/06/22 1843 10/07/22 0535  WBC 7.4 6.2  RBC 3.54* 3.79*  HGB 9.6* 10.5*  HCT 32.6* 34.5*  MCV 92.1 91.0  MCH 27.1 27.7  MCHC 29.4* 30.4  RDW 17.0* 17.2*  PLT 176 194   Thyroid  Recent Labs  Lab 10/06/22 2346  TSH 0.433    BNP Recent Labs  Lab 10/06/22 1847  BNP 719.8*    DDimer No results for input(s): "DDIMER" in the last 168 hours.   Radiology    ECHOCARDIOGRAM COMPLETE  Result Date: 10/07/2022    ECHOCARDIOGRAM REPORT   Patient Name:   Jennifer Weiss Date of Exam: 10/07/2022 Medical Rec #:  [id number]    Height:       65.0 in Accession #:    [id number]   Weight:       230.0 lb Date of Birth:  05-20-1961   BSA:          2.099 m Patient Age:    61 years     BP:           131/78 mmHg Patient Gender: F            HR:            89 bpm. Exam Location:  [facility] Procedure: 2D Echo, Cardiac Doppler and Color Doppler Indications:     I50.31 CHF Acute Diastolic  History:         Patient has prior history of Echocardiogram examinations, most                  recent 09/18/2017. COPD; Risk Factors:Current Smoker, Diabetes,                  Hypertension and Dyslipidemia.  Sonographer:     Rosalia Hammers Referring Phys:  [id number] [place] Diagnosing Phys: Serafina Royals MD IMPRESSIONS  1. Left ventricular ejection fraction, by estimation, is 25 to 30%. The left ventricle has severely decreased function. The left ventricle demonstrates global hypokinesis. The left ventricular internal cavity size was moderately dilated. Left ventricular diastolic parameters are consistent with Grade II diastolic dysfunction (pseudonormalization).  2. Right ventricular systolic function is normal. The right ventricular size is normal.  3. Left atrial size was mildly dilated.  4. The mitral valve is normal in structure. Moderate mitral valve regurgitation.  5. The aortic valve is normal in structure. Aortic valve regurgitation is not visualized. FINDINGS  Left Ventricle: Left ventricular ejection fraction, by estimation, is 25 to  30%. The left ventricle has severely decreased function. The left ventricle demonstrates global hypokinesis. The left ventricular internal cavity size was moderately dilated. There is borderline left ventricular hypertrophy. Left ventricular diastolic parameters are consistent with Grade II diastolic dysfunction (pseudonormalization). Right Ventricle: The right ventricular size is normal. No increase in right ventricular wall thickness. Right ventricular systolic function is normal. Left Atrium: Left atrial size was mildly dilated. Right Atrium: Right atrial size was normal in size. Pericardium: There is no evidence of pericardial effusion. Mitral Valve: The mitral valve is normal in structure. Moderate mitral valve regurgitation. Tricuspid  Valve: The tricuspid valve is normal in structure. Tricuspid valve regurgitation is trivial. Aortic Valve: The aortic valve is normal in structure. Aortic valve regurgitation is not visualized. Pulmonic Valve: The pulmonic valve was normal in structure. Pulmonic valve regurgitation is trivial. Aorta: The aortic root and ascending aorta are structurally normal, with no evidence of dilitation. IAS/Shunts: No atrial level shunt detected by color flow Doppler.   LV Volumes (MOD) LV vol d, MOD A2C: 190.0 ml LV vol d, MOD A4C: 217.0 ml LV vol s, MOD A2C: 147.0 ml LV vol s, MOD A4C: 157.0 ml LV SV MOD A2C:     43.0 ml LV SV MOD A4C:     217.0 ml LV SV MOD BP:      46.8 ml Serafina Royals MD Electronically signed by Serafina Royals MD Signature Date/Time: 10/07/2022/12:18:57 PM    Final     Cardiac Studies   Expand All Collapse All     Progress Note   Patient Name: Jennifer Weiss Date of Encounter: 10/08/2022   Primary Cardiologist: New - consult by End   Subjective    Dyspnea improving. Chest pain associated with cough. Documented UOP 1.4 L for the past 24 hours, net - 3.3 L for the admission. No weight. BUN trending up, stable SCr.   Inpatient Medications    Scheduled Meds:  acyclovir  800 mg Oral Daily   aspirin EC  81 mg Oral Daily   atorvastatin  40 mg Oral Daily   carvedilol  3.125 mg Oral BID WC   enoxaparin (LOVENOX) injection  40 mg Subcutaneous Q24H   furosemide  40 mg Intravenous BID   gabapentin  300 mg Oral TID   insulin aspart  0-5 Units Subcutaneous QHS   insulin aspart  0-9 Units Subcutaneous TID WC   insulin glargine-yfgn  5 Units Subcutaneous BID   ipratropium  0.5 mg Inhalation BID   losartan  25 mg Oral Daily    Continuous Infusions:   PRN Meds: acetaminophen **OR** acetaminophen, albuterol, ipratropium-albuterol, magnesium hydroxide, ondansetron **OR** ondansetron (ZOFRAN) IV, traZODone    Vital Signs          Vitals:    10/07/22 2000 10/07/22 2049 10/07/22 2354  10/08/22 0308  BP: 130/73   135/89 125/80  Pulse: 90   92 83  Resp: '20   16 16  '$ Temp: 98.1 F (36.7 C)   97.9 F (36.6 C) 97.7 F (36.5 C)  TempSrc:     Oral Oral  SpO2: 95% 96% 94% 94%  Weight:          Height:              Intake/Output Summary (Last 24 hours) at 10/08/2022 2423 Last data filed at 10/08/2022 0300    Gross per 24 hour  Intake 240 ml  Output 1700 ml  Net -1460 ml  Filed Weights    10/06/22 1840 10/07/22 0619  Weight: 94.8 kg 104.3 kg      Telemetry    SR with sinus tachycardia, 80s to low 100s bpm, rare PVCs - Personally Reviewed   ECG    No new tracings - Personally Reviewed   Physical Exam    GEN: No acute distress.   Neck: JVD elevated ~ 8 cm. Cardiac: RRR, II/VI systolic murmur LSB, no rubs, or gallops.  Respiratory: Mildly diminished bilaterally.  GI: Soft, nontender, non-distended.   MS: No edema; No deformity. Neuro:  Alert and oriented x 3; Nonfocal.  Psych: Normal affect.   Labs    Chemistry Last Labs        Recent Labs  Lab 10/06/22 1847 10/06/22 1927 10/07/22 0535 10/08/22 0409  NA  --  140 138 141  K  --  3.6 4.0 3.6  CL  --  107 106 104  CO2  --  23 22 32  GLUCOSE  --  117* 265* 174*  BUN  --  16 16 24*  CREATININE  --  1.33* 1.27* 1.26*  CALCIUM  --  8.9 8.8* 9.1  PROT 7.4  --   --   --   ALBUMIN 3.5  --   --   --   AST 27  --   --   --   ALT 22  --   --   --   ALKPHOS 110  --   --   --   BILITOT 0.8  --   --   --   GFRNONAA  --  46* 48* 49*  ANIONGAP  --  '10 10 5        '$ Hematology Last Labs      Recent Labs  Lab 10/06/22 1843 10/07/22 0535  WBC 7.4 6.2  RBC 3.54* 3.79*  HGB 9.6* 10.5*  HCT 32.6* 34.5*  MCV 92.1 91.0  MCH 27.1 27.7  MCHC 29.4* 30.4  RDW 17.0* 17.2*  PLT 176 194        Cardiac Enzymes Last Labs  No results for input(s): "TROPONINI" in the last 168 hours.    Last Labs  No results for input(s): "TROPIPOC" in the last 168 hours.      BNP Last Labs     Recent  Labs  Lab 10/06/22 1847  BNP 719.8*        DDimer  Last Labs  No results for input(s): "DDIMER" in the last 168 hours.      Radiology    DG Chest 1 View   Result Date: 10/06/2022 IMPRESSION: Pulmonary edema with trace left pleural effusion. Superimposed infection not excluded. Electronically Signed   By: Iven Finn M.D.   On: 10/06/2022 19:31     Cardiac Studies    2D echo 10/07/2022: 1. Left ventricular ejection fraction, by estimation, is 25 to 30%. The  left ventricle has severely decreased function. The left ventricle  demonstrates global hypokinesis. The left ventricular internal cavity size  was moderately dilated. Left  ventricular diastolic parameters are consistent with Grade II diastolic  dysfunction (pseudonormalization).   2. Right ventricular systolic function is normal. The right ventricular  size is normal.   3. Left atrial size was mildly dilated.   4. The mitral valve is normal in structure. Moderate mitral valve  regurgitation.   5. The aortic valve is normal in structure. Aortic valve regurgitation is  not visualized.  __________   2D echo 09/18/2017: -  Left ventricle: The cavity size was normal. Wall thickness was    increased in a pattern of mild LVH. Systolic function was normal.    The estimated ejection fraction was in the range of 60% to 65%.    Wall motion was normal; there were no regional wall motion    abnormalities. Doppler parameters are consistent with abnormal    left ventricular relaxation (grade 1 diastolic dysfunction).  - Mitral valve: There was mild regurgitation.  - Left atrium: The atrium was mildly dilated.  - Right ventricle: The cavity size was normal. Systolic function    was normal.  - Tricuspid valve: There was mild-moderate regurgitation.       Patient Profile     61 y.o. 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, syncope, DM2 with peripheral neuropathy, HTN, HLD, COPD, tobacco  use, and obesity who is being seen today for the evaluation of HFrEF   Assessment & Plan    Acute HFrEF - uncertain etiology, cannot exclude ischemia - continue Coreg 3.'125mg'$ BID and Losartan '25mg'$  daily - IV lasix '40mg'$  daily>will hold for minimally elevated Scr/BUN and heart cath today - Net -5.7L - does not appear significantly volume on exam - plan for cardiac cath today - continue GDMT as able  AKI - IV lasix decreased to '40mg'$  daily - AM labs with mildly increased Sr/BUN - will hold IV lasix for heart cath  HLD - LDL 64 02/2022 - continue PTA Atorvastatin  DM2 - A1C 6.2 - per IM  For questions or updates, please contact [place] Please consult [email or web address] for contact info under        Signed, Genifer Lazenby Ninfa Meeker, PA-C  10/09/2022, 8:24 AM

## 2022-10-10 ENCOUNTER — Encounter: Payer: Self-pay | Admitting: Cardiology

## 2022-10-10 DIAGNOSIS — J449 Chronic obstructive pulmonary disease, unspecified: Secondary | ICD-10-CM | POA: Diagnosis not present

## 2022-10-10 DIAGNOSIS — I10 Essential (primary) hypertension: Secondary | ICD-10-CM | POA: Diagnosis not present

## 2022-10-10 DIAGNOSIS — I420 Dilated cardiomyopathy: Secondary | ICD-10-CM | POA: Diagnosis not present

## 2022-10-10 DIAGNOSIS — I509 Heart failure, unspecified: Secondary | ICD-10-CM

## 2022-10-10 DIAGNOSIS — I5041 Acute combined systolic (congestive) and diastolic (congestive) heart failure: Secondary | ICD-10-CM | POA: Diagnosis not present

## 2022-10-10 LAB — CBC
HCT: 32.3 % — ABNORMAL LOW (ref 36.0–46.0)
Hemoglobin: 9.9 g/dL — ABNORMAL LOW (ref 12.0–15.0)
MCH: 27.3 pg (ref 26.0–34.0)
MCHC: 30.7 g/dL (ref 30.0–36.0)
MCV: 89.2 fL (ref 80.0–100.0)
Platelets: 213 10*3/uL (ref 150–400)
RBC: 3.62 MIL/uL — ABNORMAL LOW (ref 3.87–5.11)
RDW: 17 % — ABNORMAL HIGH (ref 11.5–15.5)
WBC: 7.1 10*3/uL (ref 4.0–10.5)
nRBC: 0 % (ref 0.0–0.2)

## 2022-10-10 LAB — BASIC METABOLIC PANEL
Anion gap: 8 (ref 5–15)
BUN: 28 mg/dL — ABNORMAL HIGH (ref 8–23)
CO2: 26 mmol/L (ref 22–32)
Calcium: 8.7 mg/dL — ABNORMAL LOW (ref 8.9–10.3)
Chloride: 106 mmol/L (ref 98–111)
Creatinine, Ser: 1.37 mg/dL — ABNORMAL HIGH (ref 0.44–1.00)
GFR, Estimated: 44 mL/min — ABNORMAL LOW (ref >60)
Glucose, Bld: 122 mg/dL — ABNORMAL HIGH (ref 70–99)
Potassium: 4.8 mmol/L (ref 3.5–5.1)
Sodium: 140 mmol/L (ref 135–145)

## 2022-10-10 LAB — GLUCOSE, CAPILLARY
Glucose-Capillary: 106 mg/dL — ABNORMAL HIGH (ref 70–99)
Glucose-Capillary: 167 mg/dL — ABNORMAL HIGH (ref 70–99)
Glucose-Capillary: 118 mg/dL — ABNORMAL HIGH (ref 70–99)
Glucose-Capillary: 100 mg/dL — ABNORMAL HIGH (ref 70–99)

## 2022-10-10 LAB — MAGNESIUM: Magnesium: 2.3 mg/dL (ref 1.7–2.4)

## 2022-10-10 LAB — PHOSPHORUS: Phosphorus: 4 mg/dL (ref 2.5–4.6)

## 2022-10-10 MED ORDER — FLUTICASONE FUROATE-VILANTEROL 100-25 MCG/ACT IN AEPB
1.0000 | INHALATION_SPRAY | Freq: Every day | RESPIRATORY_TRACT | Status: DC
  Administered 2022-10-10 – 2022-10-11 (×2): 1 via RESPIRATORY_TRACT
  Filled 2022-10-10: qty 28

## 2022-10-10 MED ORDER — FUROSEMIDE 40 MG PO TABS
40.0000 mg | ORAL_TABLET | Freq: Every day | ORAL | Status: DC
  Administered 2022-10-11: 40 mg via ORAL
  Filled 2022-10-10: qty 1

## 2022-10-10 NOTE — Progress Notes (Signed)
Rounding Note    Patient Name: Jennifer Weiss Date of Encounter: 10/10/2022  [place] Cardiologist: None   Subjective   Heart cath showed normal coronaries and mild pulmonary HTN. Patient denies chest pain or SOB. Kidney function is fairly stable. Cath sites stable.  Inpatient Medications    Scheduled Meds:  acyclovir  800 mg Oral Daily   aspirin EC  81 mg Oral Daily   atorvastatin  40 mg Oral Daily   carvedilol  3.125 mg Oral BID WC   enoxaparin (LOVENOX) injection  40 mg Subcutaneous Q24H   gabapentin  300 mg Oral TID   insulin aspart  0-5 Units Subcutaneous QHS   insulin aspart  0-9 Units Subcutaneous TID WC   insulin glargine-yfgn  5 Units Subcutaneous BID   ipratropium  0.5 mg Inhalation BID   losartan  25 mg Oral Daily   sodium chloride flush  3 mL Intravenous Q12H   Continuous Infusions:  sodium chloride     PRN Meds: sodium chloride, acetaminophen **OR** acetaminophen, albuterol, ipratropium-albuterol, magnesium hydroxide, ondansetron **OR** ondansetron (ZOFRAN) IV, sodium chloride flush, traZODone   Vital Signs    Vitals:   10/09/22 2012 10/09/22 2024 10/10/22 0019 10/10/22 0416  BP:  118/69 (!) 111/54   Pulse:  86 79   Resp:   18   Temp:  98 F (36.7 C) 98.4 F (36.9 C)   TempSrc:  Oral Oral   SpO2: 94% 95% 97%   Weight:    106.1 kg  Height:        Intake/Output Summary (Last 24 hours) at 10/10/2022 0736 Last data filed at 10/09/2022 2300 Gross per 24 hour  Intake 0 ml  Output 500 ml  Net -500 ml      10/10/2022    4:16 AM 10/09/2022    4:05 AM 10/08/2022    9:57 AM  Last 3 Weights  Weight (lbs) 233 lb 14.5 oz 234 lb 5.6 oz 229 lb  Weight (kg) 106.1 kg 106.3 kg 103.874 kg      Telemetry    NSR Hr90s, PVCs - Personally Reviewed  ECG    No new - Personally Reviewed  Physical Exam   GEN: No acute distress.   Neck: No JVD Cardiac: RRR, no murmurs, rubs, or gallops.  Respiratory: wheezing GI: Soft, nontender,  non-distended  MS: No edema; No deformity. Neuro:  Nonfocal  Psych: Normal affect   Labs    High Sensitivity Troponin:   Recent Labs  Lab 10/06/22 1847  TROPONINIHS 11     Chemistry Recent Labs  Lab 10/06/22 1847 10/06/22 1927 10/06/22 2346 10/07/22 0535 10/08/22 0409 10/09/22 0517 10/09/22 1306 10/09/22 1310 10/10/22 0428  NA  --    < >  --    < > 141 140 140 140 140  K  --    < >  --    < > 3.6 4.1 4.1 4.2 4.8  CL  --    < >  --    < > 104 104  --   --  106  CO2  --    < >  --    < > 32 30  --   --  26  GLUCOSE  --    < >  --    < > 174* 107*  --   --  122*  BUN  --    < >  --    < > 24* 28*  --   --  28*  CREATININE  --    < >  --    < > 1.26* 1.32*  --   --  1.37*  CALCIUM  --    < >  --    < > 9.1 8.5*  --   --  8.7*  MG  --   --  1.9  --   --   --   --   --  2.3  PROT 7.4  --   --   --   --   --   --   --   --   ALBUMIN 3.5  --   --   --   --   --   --   --   --   AST 27  --   --   --   --   --   --   --   --   ALT 22  --   --   --   --   --   --   --   --   ALKPHOS 110  --   --   --   --   --   --   --   --   BILITOT 0.8  --   --   --   --   --   --   --   --   GFRNONAA  --    < >  --    < > 49* 46*  --   --  44*  ANIONGAP  --    < >  --    < > 5 6  --   --  8   < > = values in this interval not displayed.    Lipids No results for input(s): "CHOL", "TRIG", "HDL", "LABVLDL", "LDLCALC", "CHOLHDL" in the last 168 hours.  Hematology Recent Labs  Lab 10/07/22 0535 10/09/22 0514 10/09/22 1306 10/09/22 1310 10/10/22 0428  WBC 6.2 9.8  --   --  7.1  RBC 3.79* 3.84*  --   --  3.62*  HGB 10.5* 10.4* 11.2* 11.2* 9.9*  HCT 34.5* 34.9* 33.0* 33.0* 32.3*  MCV 91.0 90.9  --   --  89.2  MCH 27.7 27.1  --   --  27.3  MCHC 30.4 29.8*  --   --  30.7  RDW 17.2* 17.2*  --   --  17.0*  PLT 194 232  --   --  213   Thyroid  Recent Labs  Lab 10/06/22 2346  TSH 0.433    BNP Recent Labs  Lab 10/06/22 1847  BNP 719.8*    DDimer No results for input(s): "DDIMER"  in the last 168 hours.   Radiology    CARDIAC CATHETERIZATION  Result Date: 10/09/2022 POST-OPERATIVE DIAGNOSIS:  Angiographically normal coronary arteries with essentially dominant wraparound LAD the provides flow to at least the distal two thirds of the PDA distribution, circumflex is only a AV groove circumflex to the posterior lateral system and the RCA is borderline codominant with a very small PDA that appears to come off of a marginal branch as well as a posterolateral branch coming off the main triple-vessel. AOP-MAP 116/74 mmHg - 90 mmHg; LV PF and EDP 118/15 mmHg - 22 mmHg RHC numbers: RAP mean 11 million mercury, RVP-EDP 43/8-16 mmHg; PAP-mean 45/21-28 mmHg.  PCWP 19-20 mmHg; Ao sat 88%, PA sat 57% CO-CI ([place]) 5.96-2.82 -> consistent with mild secondary pulmonary hypertension/pulmonary venous congestion due to elevated  LVEDP and PCWP; reduced cardiac index  PLAN OF CARE:  Return to nursing floor for ongoing care per cardiology consult service Glenetta Hew, MD   Cardiac Studies   Heart cath 10/09/22  POST-OPERATIVE DIAGNOSIS:   Angiographically normal coronary arteries with essentially dominant wraparound LAD the provides flow to at least the distal two thirds of the PDA distribution, circumflex is only a AV groove circumflex to the posterior lateral system and the RCA is borderline codominant with a very small PDA that appears to come off of a marginal branch as well as a posterolateral branch coming off the main triple-vessel. AOP-MAP 116/74 mmHg - 90 mmHg; LV PF and EDP 118/15 mmHg - 22 mmHg RHC numbers: RAP mean 11 million mercury, RVP-EDP 43/8-16 mmHg; PAP-mean 45/21-28 mmHg.  PCWP 19-20 mmHg; Ao sat 88%, PA sat 57% CO-CI ([place]ick) 5.96-2.82 -> consistent with mild secondary pulmonary hypertension/pulmonary venous congestion due to elevated LVEDP and PCWP; reduced cardiac index       PLAN OF CARE:  Return to nursing floor for ongoing care per cardiology consult service     Glenetta Hew, MD  2D echo 10/07/2022: 1. Left ventricular ejection fraction, by estimation, is 25 to 30%. The  left ventricle has severely decreased function. The left ventricle  demonstrates global hypokinesis. The left ventricular internal cavity size  was moderately dilated. Left  ventricular diastolic parameters are consistent with Grade II diastolic  dysfunction (pseudonormalization).   2. Right ventricular systolic function is normal. The right ventricular  size is normal.   3. Left atrial size was mildly dilated.   4. The mitral valve is normal in structure. Moderate mitral valve  regurgitation.   5. The aortic valve is normal in structure. Aortic valve regurgitation is  not visualized.  __________   2D echo 09/18/2017: - Left ventricle: The cavity size was normal. Wall thickness was    increased in a pattern of mild LVH. Systolic function was normal.    The estimated ejection fraction was in the range of 60% to 65%.    Wall motion was normal; there were no regional wall motion    abnormalities. Doppler parameters are consistent with abnormal    left ventricular relaxation (grade 1 diastolic dysfunction).  - Mitral valve: There was mild regurgitation.  - Left atrium: The atrium was mildly dilated.  - Right ventricle: The cavity size was normal. Systolic function    was normal.  - Tricuspid valve: There was mild-moderate regurgitation.   Patient Profile     61 y.o. female with a history of multiple CVA, seizure disorder versus pseudoseizure, syncope, DM2 with peripheral neuropathy, HTN, HLD, COPD, tobacco use, and obesity who is being seen today for the evaluation of HFrEF   Assessment & Plan    Acute HFrEF - LHC showed normal coronaries mild pulmonary HTN/pulmonary venous congestion due to elevated LVEDP - Coreg 3.'125mg'$ BID and Losartan '25mg'$  daily.  - Losartan held for cath>restart when able - IV lasix '40mg'$  daily>held for cath - Scr/BUN stable - Net -6.2L - does not appear  significantly volume on exam - can likely start oral lasix - continue GDMT as able   AKI - IV lasix held for cath - AM labs stable   HLD - LDL 64 02/2022 - continue PTA Atorvastatin   DM2 - A1C 6.2 - per IM  COPD/tobacco use - wheezing on exam - per IM  For questions or updates, please contact [place] Please consult [email or web address] for contact info under  Signed, [place] Valcarcel Ninfa Meeker, PA-C  10/10/2022, 7:36 AM

## 2022-10-10 NOTE — Progress Notes (Signed)
Ice pack applied to Right arm, as ordered.

## 2022-10-10 NOTE — Progress Notes (Signed)
[facility] Progress Note  Patient: Jennifer Weiss    LPF:[id number]  DOA: 10/06/2022     Date of Service: the patient was seen and examined on 10/10/2022  Chief Complaint  Patient presents with   Shortness of Breath   Brief hospital course: Taken from H&P.   Jennifer Weiss is a 61 y.o. African-American female with medical history significant for asthma type 2 diabetes mellitus, hypertension, seizure disorder, carotid artery disease, who presented to the ER with acute worsening dyspnea for 1 day. Patient has exertional dyspnea for the past 2 to 34-month  Also complaining of mild cough productive of yellowish sputum.  She admits having some wheezing and subjective fever and chills, no documentation.  No chest pain or palpitations.  She has some orthopnea and PND along with worsening lower extremity edema.   ED course.  On arrival blood pressure was 151/90, heart rate 103, sat sitting well on room air.  Labs with creatinine 1.33 with baseline below 1 and BNP of 719.8. EKG with sinus tachycardia, Q waves in inferior leads. Chest x-ray with pulmonary edema and trace left pleural effusion.   Patient was given 40 mg of IV Lasix and started on IV diuresis Cardiology was consulted.   10/24: Creatinine improved to 1.27, TSH within normal limit at 0.433.  COVID PCR negative.  Troponin negative. Echocardiogram with new diagnosis of HFrEF with EF of 25 to 30% and severely depressed LV function, global hypokinesis, grade 2 diastolic dysfunction, moderately dilated left side with mitral valve regurgitation. Noted to be on acyclovir 800 mg twice daily-Per patient she is taking it for many years for genital herpes.  Do not see any history of immunocompromised status, ask pharmacy to verify.   10/25: Shortness of breath improving.  Creatinine seems stable at 1.26, IV Lasix dose was decreased to daily due to worsening BUN.  Cardiac catheterization tomorrow  10/26 patient denies any symptoms, scheduled  for cardiac cath today. Cr 1.19 today   Assessment and Plan:  # Acute on chronic diastolic and systolic CHF Echocardiogram with new diagnosis of reduced EF of 20 to 25% and grade 2 diastolic dysfunction. Prior echocardiogram with normal EF and diastolic dysfunction. s/p IV Lasix-DC'd on 10/26 due to cardiac cath Seen by cardiology, continue low-dose carvedilol and losartan S/p Right and left heart catheterization with nonobstructive disease, minimally elevated right heart pressures, Appears euvolemic on exam. -Plan to start oral Lasix 40 mg tomorrow, to continue as outpatient -Daily weight and BMP -Strict intake and output    # Right forearm pain after cardiac cath, could be nerve irritation during cardiac cath axis No hematoma Continue as needed medication for pain control Apply ice on the affected site   # Essential hypertension Blood pressure within goal Started Coreg 3.125 twice daily Decreased losartan from 100--25 mg p.o. daily due to AKI and started Coreg Discontinued amlodipine -s/p  IV Lasix, had on 10/26 due to cardiac cath Monitor BP and titrate medication accordingly  Type 2 diabetes mellitus with peripheral neuropathy (HCC) CBG within goal today.  A1c of 6.2 -Continue Semglee 5 units twice daily -Continue with SSI   Anxiety and depression - continue  SSRI.   Dyslipidemia - continue statin therapy.   Chronic obstructive pulmonary disease (COPD) (HCC) - continue inhalers while holding off long-acting beta agonist in the setting of acute CHF.   Obesity (BMI 30-39.9) Estimated body mass index is 38.27 kg/m as calculated from the following:   Height as of this encounter: '5\' 5"'$  (  1.651 m).   Weight as of this encounter: 104.3 kg.    -This will complicate overall prognosis -Encourage weight loss   Tobacco abuse - Counseling was provided -Nicotine patch as needed.   Body mass index is 39 kg/m.  Interventions:      Diet: Heart healthy diet DVT  Prophylaxis: Subcutaneous Lovenox   Advance goals of care discussion: Full code  Family Communication: family was NOT present at bedside, at the time of interview.  The pt provided permission to discuss medical plan with the family. Opportunity was given to ask question and all questions were answered satisfactorily.   Disposition:  Pt is from home, admitted with new onset CHF, sp cardiac catheter, developed severe pain in the right forearm, which precludes a safe discharge. Discharge to Home, most likely tomorrow a.m.   Subjective: No significant overnight events.  Patient was complaining of severe pain in the right forearm after cardiac cath.  Denies any chest pain including no shortness of breath.   Physical Exam: General:  alert oriented to time, place, and person.  Appear in no distress, affect appropriate Eyes: PERRLA ENT: Oral Mucosa Clear, moist  Neck: no JVD,  Cardiovascular: S1 and S2 Present, no Murmur,  Respiratory: good respiratory effort, Bilateral Air entry equal and Decreased, mild Crackles, no wheezes Abdomen: Bowel Sound present, Soft and no tenderness,  Skin: no rashes Extremities: no Pedal edema, no calf tenderness Neurologic: without any new focal findings Gait not checked due to patient safety concerns  Vitals:   10/10/22 0019 10/10/22 0416 10/10/22 0814 10/10/22 1109  BP: (!) 111/54  124/79 (!) 105/51  Pulse: 79  86 80  Resp: '18  18 18  '$ Temp: 98.4 F (36.9 C)  98.4 F (36.9 C) 98.1 F (36.7 C)  TempSrc: Oral  Oral Oral  SpO2: 97%  100% 98%  Weight:  106.1 kg    Height:        Intake/Output Summary (Last 24 hours) at 10/10/2022 1500 Last data filed at 10/10/2022 1200 Gross per 24 hour  Intake 480 ml  Output 700 ml  Net -220 ml   Filed Weights   10/08/22 0957 10/09/22 0405 10/10/22 0416  Weight: 103.9 kg 106.3 kg 106.1 kg    Data Reviewed: I have personally reviewed and interpreted daily labs, tele strips, imagings as discussed above. I  reviewed all nursing notes, pharmacy notes, vitals, pertinent old records I have discussed plan of care as described above with RN and patient/family.  CBC: Recent Labs  Lab 10/06/22 1843 10/07/22 0535 10/09/22 0514 10/09/22 1306 10/09/22 1310 10/10/22 0428  WBC 7.4 6.2 9.8  --   --  7.1  HGB 9.6* 10.5* 10.4* 11.2* 11.2* 9.9*  HCT 32.6* 34.5* 34.9* 33.0* 33.0* 32.3*  MCV 92.1 91.0 90.9  --   --  89.2  PLT 176 194 232  --   --  237   Basic Metabolic Panel: Recent Labs  Lab 10/06/22 1927 10/06/22 2346 10/07/22 0535 10/08/22 0409 10/09/22 0517 10/09/22 1306 10/09/22 1310 10/10/22 0428  NA 140  --  138 141 140 140 140 140  K 3.6  --  4.0 3.6 4.1 4.1 4.2 4.8  CL 107  --  106 104 104  --   --  106  CO2 23  --  22 32 30  --   --  26  GLUCOSE 117*  --  265* 174* 107*  --   --  122*  BUN 16  --  16 24* 28*  --   --  28*  CREATININE 1.33*  --  1.27* 1.26* 1.32*  --   --  1.37*  CALCIUM 8.9  --  8.8* 9.1 8.5*  --   --  8.7*  MG  --  1.9  --   --   --   --   --  2.3  PHOS  --   --   --   --   --   --   --  4.0    Studies: No results found.  Scheduled Meds:  acyclovir  800 mg Oral Daily   aspirin EC  81 mg Oral Daily   atorvastatin  40 mg Oral Daily   carvedilol  3.125 mg Oral BID WC   enoxaparin (LOVENOX) injection  40 mg Subcutaneous Q24H   fluticasone furoate-vilanterol  1 puff Inhalation Daily   [START ON 10/11/2022] furosemide  40 mg Oral Daily   gabapentin  300 mg Oral TID   insulin aspart  0-5 Units Subcutaneous QHS   insulin aspart  0-9 Units Subcutaneous TID WC   insulin glargine-yfgn  5 Units Subcutaneous BID   ipratropium  0.5 mg Inhalation BID   losartan  25 mg Oral Daily   sodium chloride flush  3 mL Intravenous Q12H   Continuous Infusions:  sodium chloride     PRN Meds: sodium chloride, acetaminophen **OR** acetaminophen, albuterol, ipratropium-albuterol, magnesium hydroxide, ondansetron **OR** ondansetron (ZOFRAN) IV, sodium chloride flush,  traZODone  Time spent: 35 minutes  Author: Val Riles. MD [facility] 10/10/2022 3:00 PM  To reach On-call, see care teams to locate the attending and reach out to them via [email or web address]. If 7PM-7AM, please contact night-coverage If you still have difficulty reaching the attending provider, please page the [facility] (Director on Call) for [facility] on amion for assistance.

## 2022-10-11 DIAGNOSIS — I5041 Acute combined systolic (congestive) and diastolic (congestive) heart failure: Secondary | ICD-10-CM | POA: Diagnosis not present

## 2022-10-11 LAB — CBC
HCT: 35.8 % — ABNORMAL LOW (ref 36.0–46.0)
Hemoglobin: 10.9 g/dL — ABNORMAL LOW (ref 12.0–15.0)
MCH: 26.9 pg (ref 26.0–34.0)
MCHC: 30.4 g/dL (ref 30.0–36.0)
MCV: 88.4 fL (ref 80.0–100.0)
Platelets: 212 10*3/uL (ref 150–400)
RBC: 4.05 MIL/uL (ref 3.87–5.11)
RDW: 16.7 % — ABNORMAL HIGH (ref 11.5–15.5)
WBC: 5.6 10*3/uL (ref 4.0–10.5)
nRBC: 0 % (ref 0.0–0.2)

## 2022-10-11 LAB — MAGNESIUM: Magnesium: 2.4 mg/dL (ref 1.7–2.4)

## 2022-10-11 LAB — BASIC METABOLIC PANEL
Anion gap: 6 (ref 5–15)
BUN: 22 mg/dL (ref 8–23)
CO2: 29 mmol/L (ref 22–32)
Calcium: 8.8 mg/dL — ABNORMAL LOW (ref 8.9–10.3)
Chloride: 104 mmol/L (ref 98–111)
Creatinine, Ser: 1.22 mg/dL — ABNORMAL HIGH (ref 0.44–1.00)
GFR, Estimated: 50 mL/min — ABNORMAL LOW (ref >60)
Glucose, Bld: 114 mg/dL — ABNORMAL HIGH (ref 70–99)
Potassium: 4.4 mmol/L (ref 3.5–5.1)
Sodium: 139 mmol/L (ref 135–145)

## 2022-10-11 LAB — PHOSPHORUS: Phosphorus: 4.1 mg/dL (ref 2.5–4.6)

## 2022-10-11 LAB — GLUCOSE, CAPILLARY: Glucose-Capillary: 113 mg/dL — ABNORMAL HIGH (ref 70–99)

## 2022-10-11 LAB — LIPOPROTEIN A (LPA): Lipoprotein (a): 55.7 nmol/L — ABNORMAL HIGH (ref <75.0)

## 2022-10-11 MED ORDER — LOSARTAN POTASSIUM 25 MG PO TABS: 25.0000 mg | ORAL_TABLET | Freq: Every day | ORAL | 2 refills | Status: AC

## 2022-10-11 MED ORDER — FUROSEMIDE 40 MG PO TABS: ORAL_TABLET | ORAL | 0 refills | Status: DC

## 2022-10-11 MED ORDER — FUROSEMIDE 40 MG PO TABS: ORAL_TABLET | ORAL | 0 refills | Status: AC

## 2022-10-11 MED ORDER — LOSARTAN POTASSIUM 25 MG PO TABS: 25.0000 mg | ORAL_TABLET | Freq: Every day | ORAL | 2 refills | Status: DC

## 2022-10-11 MED ORDER — CARVEDILOL 6.25 MG PO TABS: 6.2500 mg | ORAL_TABLET | Freq: Two times a day (BID) | ORAL | 2 refills | Status: AC

## 2022-10-11 MED ORDER — CARVEDILOL 6.25 MG PO TABS: 6.2500 mg | ORAL_TABLET | Freq: Two times a day (BID) | ORAL | 2 refills | Status: DC

## 2022-10-11 NOTE — Discharge Summary (Signed)
[facility] Discharge Summary   Patient: Jennifer Weiss SPQ:[id number]  PCP: [facility], [place]  Date of admission: 10/06/2022   Date of discharge: 10/11/2022     Discharge Diagnoses:  Principal Problem:   Acute combined systolic and diastolic CHF, NYHA class 1 (HCC) Active Problems:   Essential hypertension   Diabetes mellitus type 2 with complications (HCC)   Anxiety and depression   Dyslipidemia   Chronic obstructive pulmonary disease (COPD) (HCC)   Tobacco abuse   Obesity (BMI 30-39.9)   Dilated cardiomyopathy (HCC)   SOB (shortness of breath)   Morbid obesity ([place])   Heart failure ([place])   Admitted From: Home Disposition:  Home   Recommendations for Outpatient Follow-up:  Follow-up with PCP in 1 week, continue further restriction 1.5 L/day.  Started diuresis with Lasix 40 mg p.o. daily for 3 days followed by 20 mg p.o. daily for 4 days.  Follow with PCP if fluid retention then Lasix can be continued.  Repeat BMP to check renal functions and potassium level next week. Continue to monitor BP at home and follow with PCP to titrate medications accordingly. Follow-up with cardiologist in 1 to 2 weeks.  Repeat 2D echocardiogram in 4 -6 months Follow up LABS/TEST:     Follow-up Information     [facility], [place] Follow up.   Contact information: [facility] [place] [address] [phone]         Nelva Bush, MD Follow up.   Specialty: Cardiology Contact information: [place] [address] [phone]                Diet recommendation: Cardiac diet  Activity: The patient is advised to gradually reintroduce usual activities, as tolerated  Discharge Condition: stable  Code Status: Full code   History of present illness: As per the H and P dictated on admission  Hospital Course:  Jennifer Weiss is a 61 y.o. African-American female with medical history  significant for asthma type 2 diabetes mellitus, hypertension, seizure disorder, carotid artery disease, who presented to the ER with acute worsening dyspnea for 1 day. Patient has exertional dyspnea for the past 2 to 81-month  Also complaining of mild cough productive of yellowish sputum.  She admits having some wheezing and subjective fever and chills, no documentation.  No chest pain or palpitations.  She has some orthopnea and PND along with worsening lower extremity edema. ED course.  On arrival blood pressure was 151/90, heart rate 103, sat sitting well on room air.  Labs with creatinine 1.33 with baseline below 1 and BNP of 719.8. EKG with sinus tachycardia, Q waves in inferior leads. Chest x-ray with pulmonary edema and trace left pleural effusion. Patient was given 40 mg of IV Lasix and started on IV diuresis Cardiology was consulted. 10/24: Creatinine improved to 1.27, TSH within normal limit at 0.433.  COVID PCR negative.  Troponin negative. Echocardiogram with new diagnosis of HFrEF with EF of 25 to 30% and severely depressed LV function, global hypokinesis, grade 2 diastolic dysfunction, moderately dilated left side with mitral valve regurgitation. Noted to be on acyclovir 800 mg twice daily-Per patient she is taking it for many years for genital herpes.  Do not see any history of immunocompromised status, ask pharmacy to verify. 10/25: Shortness of breath improving.  Creatinine seems stable at 1.26, IV Lasix dose was decreased to daily due to worsening BUN.  Cardiac catheterization tomorrow 10/26 patient  denies any symptoms, scheduled for cardiac cath today. Cr 1.19   Assessment and Plan: # Acute on chronic diastolic and systolic CHF Echocardiogram with new diagnosis of reduced EF of 20 to 25% and grade 2 diastolic dysfunction. Prior echocardiogram with normal EF and diastolic dysfunction. s/p IV Lasix-DC'd on 10/26 due to cardiac cath. Seen by cardiology, continue low-dose carvedilol  and losartan. S/p Right and left heart catheterization with nonobstructive disease, minimally elevated right heart pressures, Appears euvolemic on exam.  Recommended to start Lasix and discharge planning.  Patient was discharged on Lasix 40 mg p.o. daily for 3 days followed by 20 mg p.o. daily for 4 days.  Recommended to follow with cardiology for further diuresis if persistent hypovolemia.  Recommended fluid striction 1.5/day. # Right forearm pain after cardiac cath, could be nerve irritation during cardiac cath axis No hematoma, Continue as needed medication for pain control. Apply ice on the affected site.  Pain resolved today, patient agreed with the discharge planning. # Essential hypertension, Blood pressure within goal. Started Coreg 6.25 twice daily Decreased losartan from 100 mg to 25 mg p.o. daily due to AKI and started Coreg. Discontinued amlodipine. s/p  IV Lasix, hald on 10/26 due to cardiac cath.  # Type 2 diabetes mellitus with peripheral neuropathy, A1c of 6.2.  Continued home medication, patient was advised to follow-up with PCP as an outpatient.  Continue diabetic diet. # Anxiety and depression, continue  SSRI. # Dyslipidemia, continue statin therapy. # Chronic obstructive pulmonary disease (COPD), resumed inhalers, no exacerbation noticed on exam. # Obesity (BMI 30-39.9) Encourage weight loss Estimated body mass index is 38.27 kg/m as calculated from the following: Height as of this encounter: '5\' 5"'$  (1.651 m). Weight as of this encounter: 104.3 kg.  # Tobacco abuse, Counseling was provided. S/p Nicotine patch as needed.    Patient was ambulatory without any assistance. On the day of the discharge the patient's vitals were stable, and no other acute medical condition were reported by patient. the patient was felt safe to be discharge at Home.  Consultants: Cardiology Procedures: Cardiac cath, clean coronary arteries, no intervention required.  Discharge Exam: General:  Appear in no distress, no Rash; Oral Mucosa Clear, moist. Cardiovascular: S1 and S2 Present, no Murmur, Respiratory: normal respiratory effort, Bilateral Air entry present and no Crackles, no wheezes Abdomen: Bowel Sound present, Soft and no tenderness, no hernia Extremities: no Pedal edema, no calf tenderness Neurology: alert and oriented to time, place, and person affect appropriate.  Filed Weights   10/10/22 0416 10/11/22 0340 10/11/22 0449  Weight: 106.1 kg 104.6 kg 104.9 kg   Vitals:   10/11/22 0343 10/11/22 0800  BP: 113/64 112/65  Pulse: 79 85  Resp: 18   Temp: 97.7 F (36.5 C) 98 F (36.7 C)  SpO2: 96% 95%    DISCHARGE MEDICATION: Allergies as of 10/11/2022       Reactions   Metronidazole Hives   Nystatin Other (See Comments)   Reaction: unknown   Lisinopril Other (See Comments)   cough cough        Medication List     STOP taking these medications    amLODipine 5 MG tablet Commonly known as: NORVASC   fluticasone 110 MCG/ACT inhaler Commonly known as: FLOVENT HFA   sertraline 25 MG tablet Commonly known as: ZOLOFT   Symbicort 160-4.5 MCG/ACT inhaler Generic drug: budesonide-formoterol   traZODone 50 MG tablet Commonly known as: DESYREL       TAKE these medications  acyclovir 800 MG tablet Commonly known as: ZOVIRAX Take 800 mg by mouth daily.   albuterol 108 (90 Base) MCG/ACT inhaler Commonly known as: VENTOLIN HFA Inhale 2 puffs into the lungs every 4 (four) hours as needed for wheezing or shortness of breath.   amitriptyline 50 MG tablet Commonly known as: ELAVIL Take 50 mg by mouth at bedtime.   Aspir-Low 81 MG tablet Generic drug: aspirin EC Take 81 mg by mouth daily.   atorvastatin 80 MG tablet Commonly known as: LIPITOR Take 80 mg by mouth daily.   bisacodyl 5 MG EC tablet Commonly known as: DULCOLAX Take 5 mg by mouth daily as needed for moderate constipation.   Breo Ellipta 100-25 MCG/ACT Aepb Generic drug:  fluticasone furoate-vilanterol Inhale 1 puff into the lungs daily.   carvedilol 6.25 MG tablet Commonly known as: COREG Take 1 tablet (6.25 mg total) by mouth 2 (two) times daily with a meal.   FLUoxetine 20 MG capsule Commonly known as: PROZAC Take 20 mg by mouth daily.   furosemide 40 MG tablet Commonly known as: LASIX Take 1 tablet (40 mg total) by mouth daily for 3 days, THEN 0.5 tablets (20 mg total) daily for 4 days. Start taking on: October 12, 2022   gabapentin 300 MG capsule Commonly known as: NEURONTIN Take 300 mg by mouth at bedtime.   ipratropium 17 MCG/ACT inhaler Commonly known as: ATROVENT HFA Inhale 2 puffs into the lungs 2 (two) times daily.   ipratropium-albuterol 0.5-2.5 (3) MG/3ML Soln Commonly known as: DuoNeb Take 3 mLs by nebulization every 4 (four) hours as needed (Wheezing or shortness of breath).   losartan 25 MG tablet Commonly known as: COZAAR Take 1 tablet (25 mg total) by mouth daily. What changed:  medication strength how much to take   metFORMIN 500 MG tablet Commonly known as: GLUCOPHAGE Take 500 mg by mouth 2 (two) times daily with a meal.   pyridOXINE 25 MG tablet Commonly known as: VITAMIN B6 Take 1 tablet by mouth daily.   trolamine salicylate 10 % cream Commonly known as: ASPERCREME Apply 1 Application topically as needed for muscle pain.   varenicline 1 MG tablet Commonly known as: CHANTIX Take 1 mg by mouth 2 (two) times daily.   Vitamin D3 25 MCG (1000 UT) Caps Take 1,000 Units by mouth daily.       Allergies  Allergen Reactions   Metronidazole Hives   Nystatin Other (See Comments)    Reaction: unknown   Lisinopril Other (See Comments)    cough cough    Discharge Instructions     Diet - low sodium heart healthy   Complete by: As directed    Discharge instructions   Complete by: As directed    Follow-up with PCP in 1 week, continue further restriction 1.5 L/day.  Started diuresis with Lasix 40 mg p.o.  daily for 3 days followed by 20 mg p.o. daily for 4 days.  Follow with PCP if fluid retention then Lasix can be continued.  Repeat BMP to check renal functions and potassium level next week. Continue to monitor BP at home and follow with PCP to titrate medications accordingly. Follow-up with cardiologist in 1 to 2 weeks.  Repeat 2D echocardiogram in 4 -6 months   Heart Failure patients record your daily weight using the same scale at the same time of day   Complete by: As directed    Increase activity slowly   Complete by: As directed    STOP any  activity that causes chest pain, shortness of breath, dizziness, sweating, or exessive weakness   Complete by: As directed        The results of significant diagnostics from this hospitalization (including imaging, microbiology, ancillary and laboratory) are listed below for reference.    Significant Diagnostic Studies: CARDIAC CATHETERIZATION  Result Date: 10/09/2022 POST-OPERATIVE DIAGNOSIS:  Angiographically normal coronary arteries with essentially dominant wraparound LAD the provides flow to at least the distal two thirds of the PDA distribution, circumflex is only a AV groove circumflex to the posterior lateral system and the RCA is borderline codominant with a very small PDA that appears to come off of a marginal branch as well as a posterolateral branch coming off the main triple-vessel. AOP-MAP 116/74 mmHg - 90 mmHg; LV PF and EDP 118/15 mmHg - 22 mmHg RHC numbers: RAP mean 11 million mercury, RVP-EDP 43/8-16 mmHg; PAP-mean 45/21-28 mmHg.  PCWP 19-20 mmHg; Ao sat 88%, PA sat 57% CO-CI ([place]) 5.96-2.82 -> consistent with mild secondary pulmonary hypertension/pulmonary venous congestion due to elevated LVEDP and PCWP; reduced cardiac index  PLAN OF CARE:  Return to nursing floor for ongoing care per cardiology consult service Glenetta Hew, MD  ECHOCARDIOGRAM COMPLETE  Result Date: 10/07/2022    ECHOCARDIOGRAM REPORT   Patient Name:   Jennifer  Weiss Date of Exam: 10/07/2022 Medical Rec #:  [id number]    Height:       65.0 in Accession #:    [id number]   Weight:       230.0 lb Date of Birth:  1961-06-30   BSA:          2.099 m Patient Age:    52 years     BP:           131/78 mmHg Patient Gender: F            HR:           89 bpm. Exam Location:  [facility] Procedure: 2D Echo, Cardiac Doppler and Color Doppler Indications:     I50.31 CHF Acute Diastolic  History:         Patient has prior history of Echocardiogram examinations, most                  recent 09/18/2017. COPD; Risk Factors:Current Smoker, Diabetes,                  Hypertension and Dyslipidemia.  Sonographer:     Rosalia Hammers Referring Phys:  [id number] [place] Diagnosing Phys: Serafina Royals MD IMPRESSIONS  1. Left ventricular ejection fraction, by estimation, is 25 to 30%. The left ventricle has severely decreased function. The left ventricle demonstrates global hypokinesis. The left ventricular internal cavity size was moderately dilated. Left ventricular diastolic parameters are consistent with Grade II diastolic dysfunction (pseudonormalization).  2. Right ventricular systolic function is normal. The right ventricular size is normal.  3. Left atrial size was mildly dilated.  4. The mitral valve is normal in structure. Moderate mitral valve regurgitation.  5. The aortic valve is normal in structure. Aortic valve regurgitation is not visualized. FINDINGS  Left Ventricle: Left ventricular ejection fraction, by estimation, is 25 to 30%. The left ventricle has severely decreased function. The left ventricle demonstrates global hypokinesis. The left ventricular internal cavity size was moderately dilated. There is borderline left ventricular hypertrophy. Left ventricular diastolic parameters are consistent with Grade II diastolic dysfunction (pseudonormalization). Right Ventricle: The right ventricular size is normal. No increase in right ventricular  wall thickness. Right ventricular systolic  function is normal. Left Atrium: Left atrial size was mildly dilated. Right Atrium: Right atrial size was normal in size. Pericardium: There is no evidence of pericardial effusion. Mitral Valve: The mitral valve is normal in structure. Moderate mitral valve regurgitation. Tricuspid Valve: The tricuspid valve is normal in structure. Tricuspid valve regurgitation is trivial. Aortic Valve: The aortic valve is normal in structure. Aortic valve regurgitation is not visualized. Pulmonic Valve: The pulmonic valve was normal in structure. Pulmonic valve regurgitation is trivial. Aorta: The aortic root and ascending aorta are structurally normal, with no evidence of dilitation. IAS/Shunts: No atrial level shunt detected by color flow Doppler.   LV Volumes (MOD) LV vol d, MOD A2C: 190.0 ml LV vol d, MOD A4C: 217.0 ml LV vol s, MOD A2C: 147.0 ml LV vol s, MOD A4C: 157.0 ml LV SV MOD A2C:     43.0 ml LV SV MOD A4C:     217.0 ml LV SV MOD BP:      46.8 ml Serafina Royals MD Electronically signed by Serafina Royals MD Signature Date/Time: 10/07/2022/12:18:57 PM    Final    DG Chest 1 View  Result Date: 10/06/2022 CLINICAL DATA:  [id number] SOB (shortness of breath) [id number] EXAM: CHEST  1 VIEW COMPARISON:  Chest x-ray 05/29/2017, CT chest 09/17/2017 FINDINGS: The heart and mediastinal contours are unchanged. No focal consolidation. Increased interstitial markings. Trace left pleural effusion. No right pleural effusion. No pneumothorax. No acute osseous abnormality. IMPRESSION: Pulmonary edema with trace left pleural effusion. Superimposed infection not excluded. Electronically Signed   By: Iven Finn M.D.   On: 10/06/2022 19:31    Microbiology: Recent Results (from the past 240 hour(s))  SARS Coronavirus 2 by RT PCR (hospital order, performed in [facility] hospital lab) *cepheid single result test* Anterior Nasal Swab     Status: None   Collection Time: 10/06/22  8:16 PM   Specimen: Anterior Nasal Swab  Result Value Ref  Range Status   SARS Coronavirus 2 by RT PCR NEGATIVE NEGATIVE Final    Comment: (NOTE) SARS-CoV-2 target nucleic acids are NOT DETECTED.  The SARS-CoV-2 RNA is generally detectable in upper and lower respiratory specimens during the acute phase of infection. The lowest concentration of SARS-CoV-2 viral copies this assay can detect is 250 copies / mL. A negative result does not preclude SARS-CoV-2 infection and should not be used as the sole basis for treatment or other patient management decisions.  A negative result may occur with improper specimen collection / handling, submission of specimen other than nasopharyngeal swab, presence of viral mutation(s) within the areas targeted by this assay, and inadequate number of viral copies (<250 copies / mL). A negative result must be combined with clinical observations, patient history, and epidemiological information.  Fact Sheet for Patients:   https://www.patel.info/  Fact Sheet for Healthcare Providers: https://hall.com/  This test is not yet approved or  cleared by the [place] FDA and has been authorized for detection and/or diagnosis of SARS-CoV-2 by FDA under an Emergency Use Authorization (EUA).  This EUA will remain in effect (meaning this test can be used) for the duration of the COVID-19 declaration under Section 564(b)(1) of the Act, 21 U.S.C. section 360bbb-3(b)(1), unless the authorization is terminated or revoked sooner.  Performed at [facility], [address] [place], [place] [address]      Labs: CBC: Recent Labs  Lab 10/06/22 1843 10/07/22 0535 10/09/22 0514 10/09/22 1306 10/09/22 1310 10/10/22  3557 10/11/22 0528  WBC 7.4 6.2 9.8  --   --  7.1 5.6  HGB 9.6* 10.5* 10.4* 11.2* 11.2* 9.9* 10.9*  HCT 32.6* 34.5* 34.9* 33.0* 33.0* 32.3* 35.8*  MCV 92.1 91.0 90.9  --   --  89.2 88.4  PLT 176 194 232  --   --  213 322   Basic Metabolic  Panel: Recent Labs  Lab 10/06/22 2346 10/07/22 0535 10/08/22 0409 10/09/22 0517 10/09/22 1306 10/09/22 1310 10/10/22 0428 10/11/22 0528  NA  --  138 141 140 140 140 140 139  K  --  4.0 3.6 4.1 4.1 4.2 4.8 4.4  CL  --  106 104 104  --   --  106 104  CO2  --  22 32 30  --   --  26 29  GLUCOSE  --  265* 174* 107*  --   --  122* 114*  BUN  --  16 24* 28*  --   --  28* 22  CREATININE  --  1.27* 1.26* 1.32*  --   --  1.37* 1.22*  CALCIUM  --  8.8* 9.1 8.5*  --   --  8.7* 8.8*  MG 1.9  --   --   --   --   --  2.3 2.4  PHOS  --   --   --   --   --   --  4.0 4.1   Liver Function Tests: Recent Labs  Lab 10/06/22 1847  AST 27  ALT 22  ALKPHOS 110  BILITOT 0.8  PROT 7.4  ALBUMIN 3.5   No results for input(s): "LIPASE", "AMYLASE" in the last 168 hours. No results for input(s): "AMMONIA" in the last 168 hours. Cardiac Enzymes: No results for input(s): "CKTOTAL", "CKMB", "CKMBINDEX", "TROPONINI" in the last 168 hours. BNP (last 3 results) Recent Labs    10/06/22 1847  BNP 719.8*   CBG: Recent Labs  Lab 10/10/22 0811 10/10/22 1107 10/10/22 1609 10/10/22 2155 10/11/22 0831  GLUCAP 106* 167* 118* 100* 113*    Time spent: 35 minutes  Signed:  Val Riles  [facility] 10/11/2022  3:21 PM

## 2022-10-11 NOTE — Progress Notes (Signed)
Discharge instructions reviewed with pt, daughter Mickel Baas, and grand-daughter.  Med list  reviewed, CHF teaching completed, low salt diet and fluid rx reviewed.  Pt and family verbalized understanding.  Tele and heplock removed.  Pt discharged via [facility] with this RN without incident.

## 2022-10-13 ENCOUNTER — Telehealth: Payer: Self-pay | Admitting: Internal Medicine

## 2022-10-13 NOTE — Telephone Encounter (Signed)
Called to schedule Number not in service

## 2022-10-13 NOTE — Telephone Encounter (Signed)
-----   Message from Minna Merritts, MD sent at 10/10/2022 11:18 AM EDT ----- Hospital d/c over the weekend likely October 28 Patient  of Dr. Saunders Revel Needs routine follow-up Thx TG

## 2022-10-15 ENCOUNTER — Telehealth: Payer: Self-pay | Admitting: Family

## 2022-10-15 ENCOUNTER — Ambulatory Visit: Payer: Medicare Other | Admitting: Family

## 2022-10-15 NOTE — Telephone Encounter (Signed)
Patient did not show for her initial  [facility] appointment on 10/15/22. Will attempt to reschedule.

## 2023-04-24 ENCOUNTER — Emergency Department: Payer: 59

## 2023-04-24 DIAGNOSIS — I509 Heart failure, unspecified: Secondary | ICD-10-CM | POA: Insufficient documentation

## 2023-04-24 DIAGNOSIS — J449 Chronic obstructive pulmonary disease, unspecified: Secondary | ICD-10-CM | POA: Insufficient documentation

## 2023-04-24 DIAGNOSIS — J4541 Moderate persistent asthma with (acute) exacerbation: Secondary | ICD-10-CM | POA: Insufficient documentation

## 2023-04-24 DIAGNOSIS — R0602 Shortness of breath: Secondary | ICD-10-CM | POA: Diagnosis present

## 2023-04-24 LAB — BASIC METABOLIC PANEL
Anion gap: 11 (ref 5–15)
BUN: 8 mg/dL (ref 8–23)
CO2: 26 mmol/L (ref 22–32)
Calcium: 8.6 mg/dL — ABNORMAL LOW (ref 8.9–10.3)
Chloride: 103 mmol/L (ref 98–111)
Creatinine, Ser: 1.02 mg/dL — ABNORMAL HIGH (ref 0.44–1.00)
GFR, Estimated: >60 mL/min (ref >60)
Glucose, Bld: 129 mg/dL — ABNORMAL HIGH (ref 70–99)
Potassium: 3 mmol/L — ABNORMAL LOW (ref 3.5–5.1)
Sodium: 140 mmol/L (ref 135–145)

## 2023-04-24 LAB — CBC
HCT: 36.4 % (ref 36.0–46.0)
Hemoglobin: 11.1 g/dL — ABNORMAL LOW (ref 12.0–15.0)
MCH: 29.1 pg (ref 26.0–34.0)
MCHC: 30.5 g/dL (ref 30.0–36.0)
MCV: 95.3 fL (ref 80.0–100.0)
Platelets: 204 10*3/uL (ref 150–400)
RBC: 3.82 MIL/uL — ABNORMAL LOW (ref 3.87–5.11)
RDW: 15.1 % (ref 11.5–15.5)
WBC: 7.8 10*3/uL (ref 4.0–10.5)
nRBC: 0 % (ref 0.0–0.2)

## 2023-04-24 LAB — TROPONIN I (HIGH SENSITIVITY): Troponin I (High Sensitivity): 13 ng/L (ref <18)

## 2023-04-24 NOTE — ED Triage Notes (Signed)
Pt sts that she has been having chest pain for the last three days. Pt sts that she has a hx of CHF and MI.

## 2023-04-25 ENCOUNTER — Emergency Department
Admission: EM | Admit: 2023-04-25 | Discharge: 2023-04-25 | Disposition: A | Discharge status: Home / Self Care | Payer: 59 | Attending: Emergency Medicine | Admitting: Emergency Medicine

## 2023-04-25 DIAGNOSIS — R079 Chest pain, unspecified: Secondary | ICD-10-CM

## 2023-04-25 DIAGNOSIS — J4541 Moderate persistent asthma with (acute) exacerbation: Secondary | ICD-10-CM

## 2023-04-25 LAB — BRAIN NATRIURETIC PEPTIDE: B Natriuretic Peptide: 174.8 pg/mL — ABNORMAL HIGH (ref 0.0–100.0)

## 2023-04-25 MED ORDER — METHYLPREDNISOLONE 4 MG PO TBPK: ORAL_TABLET | ORAL | 0 refills | Status: AC

## 2023-04-25 MED ORDER — IPRATROPIUM-ALBUTEROL 0.5-2.5 (3) MG/3ML IN SOLN
3.0000 mL | Freq: Once | RESPIRATORY_TRACT | Status: AC
  Administered 2023-04-25: 3 mL via RESPIRATORY_TRACT
  Filled 2023-04-25: qty 3

## 2023-04-25 MED ORDER — AZITHROMYCIN 250 MG PO TABS: ORAL_TABLET | ORAL | 0 refills | Status: AC

## 2023-04-25 MED ORDER — METHYLPREDNISOLONE SODIUM SUCC 125 MG IJ SOLR
125.0000 mg | Freq: Once | INTRAMUSCULAR | Status: AC
  Administered 2023-04-25: 125 mg via INTRAMUSCULAR
  Filled 2023-04-25: qty 2

## 2023-04-25 NOTE — ED Provider Notes (Signed)
[facility] Provider Note   Event Date/Time   First MD Initiated Contact with Patient 04/25/23 0007     (approximate) History  Chest Pain  HPI Jennifer Weiss is a 62 y.o. female with stated past medical history of ACS, CHF, and asthma/COPD who presents complaining of chest tightness that has been worsening over the last 3 days.  Patient states she has had increasing shortness of breath and wheezing over this time as well.  Patient denies any recent travel or sick contacts.  Patient does endorse worsening with exertion ROS: Patient currently denies any vision changes, tinnitus, difficulty speaking, facial droop, sore throat, abdominal pain, nausea/vomiting/diarrhea, dysuria, or weakness/numbness/paresthesias in any extremity   Physical Exam  Triage Vital Signs: ED Triage Vitals [04/24/23 2245]  Enc Vitals Group     BP (!) 145/68     Pulse Rate 83     Resp 17     Temp 98.1 F (36.7 C)     Temp Source Oral     SpO2 96 %     Weight 260 lb (117.9 kg)     Height      Head Circumference      Peak Flow      Pain Score 10     Pain Loc      Pain Edu?      Excl. in GC?    Most recent vital signs: Vitals:   04/24/23 2245  BP: (!) 145/68  Pulse: 83  Resp: 17  Temp: 98.1 F (36.7 C)  SpO2: 96%   General: Awake, oriented x4. CV:  Good peripheral perfusion.  Resp:  Normal effort.  Expiratory wheezing over bilateral lung fields Abd:  No distention.  Other:  Morbidly obese elderly African-American female laying in bed in no acute distress ED Results / Procedures / Treatments  Labs (all labs ordered are listed, but only abnormal results are displayed) Labs Reviewed  BASIC METABOLIC PANEL - Abnormal; Notable for the following components:      Result Value   Potassium 3.0 (*)    Glucose, Bld 129 (*)    Creatinine, Ser 1.02 (*)    Calcium 8.6 (*)    All other components within normal limits  CBC - Abnormal; Notable for the following components:   RBC 3.82  (*)    Hemoglobin 11.1 (*)    All other components within normal limits  BRAIN NATRIURETIC PEPTIDE - Abnormal; Notable for the following components:   B Natriuretic Peptide 174.8 (*)    All other components within normal limits  TROPONIN I (HIGH SENSITIVITY)   EKG ED ECG REPORT I, Merwyn Katos, the attending physician, personally viewed and interpreted this ECG. Date: 04/25/2023 EKG Time: 2250 Rate: 90 Rhythm: normal sinus rhythm QRS Axis: normal Intervals: normal ST/T Wave abnormalities: normal Narrative Interpretation: Normal sinus rhythm with PVCs no evidence of acute ischemia RADIOLOGY ED MD interpretation: 2 view chest x-ray interpreted independently by me shows cardiomegaly with probable atelectasis in the right middle lobe -Agree with radiology assessment Official radiology report(s): DG Chest 2 View  Result Date: 04/24/2023 CLINICAL DATA:  Chest pain EXAM: CHEST - 2 VIEW COMPARISON:  10/06/2022, 12/31/2016 FINDINGS: Cardiomegaly. Probable atelectasis at the right middle lobe. Aortic atherosclerosis. No pneumothorax IMPRESSION: Cardiomegaly. Probable atelectasis at the right middle lobe. Electronically Signed   By: Jasmine Pang M.D.   On: 04/24/2023 23:29   PROCEDURES: Critical Care performed: No Procedures MEDICATIONS ORDERED IN ED: Medications  ipratropium-albuterol (DUONEB) 0.5-2.5 (  3) MG/3ML nebulizer solution 3 mL (3 mLs Nebulization Given 04/25/23 0057)  methylPREDNISolone sodium succinate (SOLU-MEDROL) 125 mg/2 mL injection 125 mg (125 mg Intramuscular Given 04/25/23 0057)   IMPRESSION / MDM / ASSESSMENT AND PLAN / ED COURSE  I reviewed the triage vital signs and the nursing notes.                             The patient is on the cardiac monitor to evaluate for evidence of arrhythmia and/or significant heart rate changes. Patient's presentation is most consistent with acute presentation with potential threat to life or bodily function. Presentation most  consistent with acute asthma exacerbation.  Given patient's tobacco abuse, I have concern for possible COPD however patient does not currently hold this diagnosis.  Presentation less concerning for pneumonia, heart failure, foreign body airway obstruction, pulmonary embolism, tamponade, atypical ACS  Reassuring factors: No AMS, silent respirations, belly-breathing, or other sign of impending ventilatory failure. Never intubated or admitted to the hospital for asthma exacerbation.  Workup Defer labs and imaging given clinically in exacerbation of known asthma with similar exacerbation presentations per patient.  Reassessment: Patient respiratory status improved with breathing treatment.  Disposition: Discharge home with return precautions. Advised to follow up with primary care physician within next 24-48 hours.   FINAL CLINICAL IMPRESSION(S) / ED DIAGNOSES   Final diagnoses:  Chest pain, unspecified type  Moderate persistent asthma with exacerbation   Rx / DC Orders   ED Discharge Orders          Ordered    methylPREDNISolone (MEDROL DOSEPAK) 4 MG TBPK tablet        04/25/23 0135    azithromycin (ZITHROMAX Z-PAK) 250 MG tablet        04/25/23 0135           Note:  This document was prepared using Dragon voice recognition software and may include unintentional dictation errors.   Merwyn Katos, MD 04/25/23 [phone]

## 2023-04-25 NOTE — ED Notes (Addendum)
error 

## 2023-06-01 ENCOUNTER — Emergency Department
Admission: EM | Admit: 2023-06-01 | Discharge: 2023-06-02 | Disposition: A | Discharge status: Home / Self Care | Payer: 59 | Attending: Student in an Organized Health Care Education/Training Program | Admitting: Student in an Organized Health Care Education/Training Program

## 2023-06-01 ENCOUNTER — Emergency Department: Payer: 59

## 2023-06-01 DIAGNOSIS — S99912A Unspecified injury of left ankle, initial encounter: Secondary | ICD-10-CM | POA: Diagnosis present

## 2023-06-01 DIAGNOSIS — W182XXA Fall in (into) shower or empty bathtub, initial encounter: Secondary | ICD-10-CM | POA: Insufficient documentation

## 2023-06-01 DIAGNOSIS — Y92002 Bathroom of unspecified non-institutional (private) residence single-family (private) house as the place of occurrence of the external cause: Secondary | ICD-10-CM | POA: Insufficient documentation

## 2023-06-01 DIAGNOSIS — S93402A Sprain of unspecified ligament of left ankle, initial encounter: Secondary | ICD-10-CM | POA: Insufficient documentation

## 2023-06-01 NOTE — ED Provider Notes (Signed)
[facility] Provider Note    Event Date/Time   First MD Initiated Contact with Patient 06/01/23 2221     (approximate)   History   Fall (Tripped and fell getting out of the shower. Denies hitting head or LOC. States she hit L ankle during fall. No obvious deformity noted. Minor swelling. Unable to bare weight) and Ankle Pain   HPI  Jennifer Weiss is a 62 y.o. female who presents to the ER for evaluation after tripping and falling after getting a shower.  Rolled her ankle.  Has some moderate swelling..  No left hip pain no knee pain.  No back pain.  Did not hit her head.     Physical Exam   Triage Vital Signs: ED Triage Vitals  Enc Vitals Group     BP 06/01/23 2218 138/75     Pulse Rate 06/01/23 2218 70     Resp 06/01/23 2218 18     Temp 06/01/23 2218 98.1 F (36.7 C)     Temp Source 06/01/23 2218 Oral     SpO2 06/01/23 2218 100 %     Weight --      Height --      Head Circumference --      Peak Flow --      Pain Score 06/01/23 2216 10     Pain Loc --      Pain Edu? --      Excl. in GC? --     Most recent vital signs: Vitals:   06/01/23 2218  BP: 138/75  Pulse: 70  Resp: 18  Temp: 98.1 F (36.7 C)  SpO2: 100%     Constitutional: Alert  Eyes: Conjunctivae are normal.  Head: Atraumatic. Nose: No congestion/rhinnorhea. Mouth/Throat: Mucous membranes are moist.   Neck: Painless ROM.  Cardiovascular:   Good peripheral circulation. Respiratory: Normal respiratory effort.  No retractions.  Gastrointestinal: Soft and nontender.  Musculoskeletal:  no deformity Neurologic: Mild swelling over the lateral left ankle.  No instability.  Neurovascular intact distally.  No pain of the lower leg.  No pain with logroll of the pelvis.  Compartments are soft.  o gross focal neurologic deficits are appreciated.  Skin:  Skin is warm, dry and intact. No rash noted. Psychiatric: Mood and affect are normal. Speech and behavior are normal.    ED  Results / Procedures / Treatments   Labs (all labs ordered are listed, but only abnormal results are displayed) Labs Reviewed - No data to display   EKG     RADIOLOGY Please see ED Course for my review and interpretation.  I personally reviewed all radiographic images ordered to evaluate for the above acute complaints and reviewed radiology reports and findings.  These findings were personally discussed with the patient.  Please see medical record for radiology report.    PROCEDURES:  Critical Care performed:   Procedures   MEDICATIONS ORDERED IN ED: Medications - No data to display   IMPRESSION / MDM / ASSESSMENT AND PLAN / ED COURSE  I reviewed the triage vital signs and the nursing notes.                              Differential diagnosis includes, but is not limited to, fracture, contusion, dislocation  62 y.o. female with left ankle injury. Patient is AFVSS in ED. Exam as above. Given current presentation have considered the above differential. Denies any other  injuries. Denies motor or sensory loss. Able to bear weight. Afebrile and VSS in Ed. Exam as above. NV intact throughout and distal to injury. Pt able to range joint. No clinical suspicion for infectious process or septic joint. Treatments will include observation, X-rays.  X-rays w/o fracture. No other injuries reported or noted on exam. Presentation c/w sprain. Discussed supportive care and follow up with pt.        FINAL CLINICAL IMPRESSION(S) / ED DIAGNOSES   Final diagnoses:  Sprain of left ankle, unspecified ligament, initial encounter     Rx / DC Orders   ED Discharge Orders     None        Note:  This document was prepared using Dragon voice recognition software and may include unintentional dictation errors.    Willy Eddy, MD 06/01/23 2322

## 2023-06-02 NOTE — ED Notes (Signed)
This RN made an unsuccessful attempt to contact patient daughter in reference to discharge transportation. Prior RN spoke with daughter and she stated she was on the way. Patient is resting in the room

## 2023-06-02 NOTE — ED Notes (Signed)
This RN made an unsuccessful attempt to contact patient daughter in reference to discharge transportation. Prior RN spoke with daughter and she stated she was on the way. This RN left a voicemail for daughter. Patient is resting in the room

## 2024-02-28 ENCOUNTER — Emergency Department
Admission: EM | Admit: 2024-02-28 | Discharge: 2024-02-28 | Disposition: A | Discharge status: Home / Self Care | Attending: Emergency Medicine | Admitting: Emergency Medicine

## 2024-02-28 ENCOUNTER — Emergency Department

## 2024-02-28 ENCOUNTER — Other Ambulatory Visit: Payer: Self-pay

## 2024-02-28 DIAGNOSIS — J45909 Unspecified asthma, uncomplicated: Secondary | ICD-10-CM | POA: Insufficient documentation

## 2024-02-28 DIAGNOSIS — R079 Chest pain, unspecified: Secondary | ICD-10-CM

## 2024-02-28 DIAGNOSIS — R0602 Shortness of breath: Secondary | ICD-10-CM | POA: Diagnosis present

## 2024-02-28 DIAGNOSIS — I509 Heart failure, unspecified: Secondary | ICD-10-CM | POA: Diagnosis not present

## 2024-02-28 DIAGNOSIS — J441 Chronic obstructive pulmonary disease with (acute) exacerbation: Secondary | ICD-10-CM | POA: Insufficient documentation

## 2024-02-28 LAB — RESP PANEL BY RT-PCR (RSV, FLU A&B, COVID)  RVPGX2
Influenza A by PCR: NEGATIVE
Influenza B by PCR: NEGATIVE
Resp Syncytial Virus by PCR: NEGATIVE
SARS Coronavirus 2 by RT PCR: NEGATIVE

## 2024-02-28 LAB — TROPONIN I (HIGH SENSITIVITY)
Troponin I (High Sensitivity): 8 ng/L (ref <18)
Troponin I (High Sensitivity): 7 ng/L (ref <18)

## 2024-02-28 LAB — CBC
HCT: 38.9 % (ref 36.0–46.0)
Hemoglobin: 12.4 g/dL (ref 12.0–15.0)
MCH: 29.8 pg (ref 26.0–34.0)
MCHC: 31.9 g/dL (ref 30.0–36.0)
MCV: 93.5 fL (ref 80.0–100.0)
Platelets: 172 10*3/uL (ref 150–400)
RBC: 4.16 MIL/uL (ref 3.87–5.11)
RDW: 15.5 % (ref 11.5–15.5)
WBC: 7.3 10*3/uL (ref 4.0–10.5)
nRBC: 0 % (ref 0.0–0.2)

## 2024-02-28 LAB — BASIC METABOLIC PANEL
Anion gap: 6 (ref 5–15)
BUN: 13 mg/dL (ref 8–23)
CO2: 26 mmol/L (ref 22–32)
Calcium: 8.5 mg/dL — ABNORMAL LOW (ref 8.9–10.3)
Chloride: 107 mmol/L (ref 98–111)
Creatinine, Ser: 1.06 mg/dL — ABNORMAL HIGH (ref 0.44–1.00)
GFR, Estimated: 59 mL/min — ABNORMAL LOW (ref >60)
Glucose, Bld: 139 mg/dL — ABNORMAL HIGH (ref 70–99)
Potassium: 4.1 mmol/L (ref 3.5–5.1)
Sodium: 139 mmol/L (ref 135–145)

## 2024-02-28 MED ORDER — IPRATROPIUM-ALBUTEROL 0.5-2.5 (3) MG/3ML IN SOLN
3.0000 mL | Freq: Once | RESPIRATORY_TRACT | Status: AC
  Administered 2024-02-28: 3 mL via RESPIRATORY_TRACT

## 2024-02-28 MED ORDER — PREDNISONE 20 MG PO TABS: 40.0000 mg | ORAL_TABLET | Freq: Every day | ORAL | 0 refills | Status: AC

## 2024-02-28 MED ORDER — AMOXICILLIN-POT CLAVULANATE 875-125 MG PO TABS
1.0000 | ORAL_TABLET | Freq: Two times a day (BID) | ORAL | 0 refills | Status: AC
Start: 2024-02-28 — End: 2024-03-06

## 2024-02-28 MED ORDER — AZITHROMYCIN 500 MG PO TABS: 500.0000 mg | ORAL_TABLET | Freq: Every day | ORAL | 0 refills | Status: AC

## 2024-02-28 MED ORDER — IPRATROPIUM-ALBUTEROL 0.5-2.5 (3) MG/3ML IN SOLN
3.0000 mL | Freq: Once | RESPIRATORY_TRACT | Status: AC
  Administered 2024-02-28: 3 mL via RESPIRATORY_TRACT
  Filled 2024-02-28: qty 3

## 2024-02-28 MED ORDER — PREDNISONE 20 MG PO TABS
60.0000 mg | ORAL_TABLET | Freq: Once | ORAL | Status: AC
  Administered 2024-02-28: 60 mg via ORAL
  Filled 2024-02-28: qty 3

## 2024-02-28 NOTE — ED Triage Notes (Addendum)
 Patient C/O sudden-onset left-sided chest pain and SOB that began about an hour ago. Patient states she has has a history of HTN, CHF, and asthma. She states she has had a cough for about two weeks. Patient was given 324 of aspirin by EMS

## 2024-02-28 NOTE — ED Provider Notes (Signed)
 [facility] Provider Note    Event Date/Time   First MD Initiated Contact with Patient 02/28/24 0225     (approximate)   History   Chest Pain and Shortness of Breath   HPI  Jennifer Weiss is a 63 year old female with history of ACS, CHF, asthma/COPD presenting to the emergency department for evaluation of chest pain and shortness of breath.  Symptoms started shortly prior to presentation, but has been having an ongoing cough for several weeks.  Received 324 of aspirin with EMS.  Reports that she has had similar symptoms multiple times in the past, unsure cause.  Reports pain is sharp, most notably over the left side of her chest.  Reports pain is largely improved, but does have some mild ongoing pain.  Received DuoNeb from triage, did report some improvement with this but reports ongoing wheezing.  No reported fevers.      Physical Exam   Triage Vital Signs: ED Triage Vitals  Encounter Vitals Group     BP 02/28/24 0230 (!) 146/72     Systolic BP Percentile --      Diastolic BP Percentile --      Pulse Rate 02/28/24 0230 68     Resp 02/28/24 0230 (!) 23     Temp 02/28/24 0230 97.7 F (36.5 C)     Temp Source 02/28/24 0230 Oral     SpO2 02/28/24 0230 100 %     Weight 02/28/24 0223 230 lb (104.3 kg)     Height 02/28/24 0223 5\' 5"  (1.651 m)     Head Circumference --      Peak Flow --      Pain Score --      Pain Loc --      Pain Education --      Exclude from Growth Chart --     Most recent vital signs: Vitals:   02/28/24 0230 02/28/24 0400  BP: (!) 146/72 (!) 166/85  Pulse: 68 66  Resp: (!) 23 (!) 23  Temp:    SpO2: 100% 98%     General: Awake, interactive  CV:  Regular rate, good peripheral perfusion.  Resp:  Respirations not severely labored, does have diminished air movement with expiratory wheezing diffusely Abd:  Nondistended, soft, nontender to palpation Neuro:  Symmetric facial movement, fluid speech   ED Results / Procedures /  Treatments   Labs (all labs ordered are listed, but only abnormal results are displayed) Labs Reviewed  BASIC METABOLIC PANEL - Abnormal; Notable for the following components:      Result Value   Glucose, Bld 139 (*)    Creatinine, Ser 1.06 (*)    Calcium 8.5 (*)    GFR, Estimated 59 (*)    All other components within normal limits  RESP PANEL BY RT-PCR (RSV, FLU A&B, COVID)  RVPGX2  CBC  TROPONIN I (HIGH SENSITIVITY)  TROPONIN I (HIGH SENSITIVITY)     EKG EKG independently reviewed interpreted by myself (ER attending) demonstrates:  EKG demonstrates sinus rhythm rate of 68, PR 132, QRS 90, QTc 495, no acute ST changes  RADIOLOGY Imaging independently reviewed and interpreted by myself demonstrates:  CXR with possible mild right infiltrate  PROCEDURES:  Critical Care performed: No  Procedures   MEDICATIONS ORDERED IN ED: Medications  ipratropium-albuterol (DUONEB) 0.5-2.5 (3) MG/3ML nebulizer solution 3 mL (3 mLs Nebulization Given 02/28/24 0232)  ipratropium-albuterol (DUONEB) 0.5-2.5 (3) MG/3ML nebulizer solution 3 mL (3 mLs Nebulization Given 02/28/24 0335)  predniSONE (DELTASONE) tablet 60 mg (60 mg Oral Given 02/28/24 0335)     IMPRESSION / MDM / ASSESSMENT AND PLAN / ED COURSE  I reviewed the triage vital signs and the nursing notes.  Differential diagnosis includes, but is not limited to, asthma exacerbation, pneumonia, pneumothorax, ACS, arrhythmia, anemia  Patient's presentation is most consistent with acute presentation with potential threat to life or bodily function.  63 year old female presenting with chest pain and shortness of breath.  Wheezy on exam.  Will obtain x-Davison Ohms, labs, treat with nebs and steroids and reevaluate.  Lab work overall reassuring with stable creatinine.  Negative troponin x 2.  EKG reassuring.  X-Lacinda Curvin with questionable mild pneumonia.  With new cardiorespiratory symptoms, we will go ahead and treat.  However, no evidence of sepsis and  satting appropriately on room air, do that she is appropriate for outpatient management.  On reevaluation, patient reports she feels much improved.  Suspect her presentation is likely related to asthma flare in the setting of respiratory illness.  Do think she is stable for discharge with strict return precautions.  Patient discharged stable condition.      FINAL CLINICAL IMPRESSION(S) / ED DIAGNOSES   Final diagnoses:  COPD exacerbation (HCC)  Nonspecific chest pain     Rx / DC Orders   ED Discharge Orders          Ordered    predniSONE (DELTASONE) 20 MG tablet  Daily with breakfast        02/28/24 0444    amoxicillin-clavulanate (AUGMENTIN) 875-125 MG tablet  2 times daily        02/28/24 0444    azithromycin (ZITHROMAX) 500 MG tablet  Daily        02/28/24 0444             Note:  This document was prepared using Dragon voice recognition software and may include unintentional dictation errors.   Trinna Post, MD 02/28/24 [phone]

## 2024-02-28 NOTE — Discharge Instructions (Addendum)
 You were seen in the ER today for chest pain and shortness of breath.  I suspect this is likely related to a flare of your asthma/COPD.  Your x-Calan Doren also shows a possible mild pneumonia.  I sent a prescription for steroids as well as 2 antibiotics to your pharmacy.  Please take these as directed.  Follow with your primary care doctor for further evaluation.  Return to the ER for new or worsening symptoms.

## 2024-06-11 DIAGNOSIS — Z7984 Long term (current) use of oral hypoglycemic drugs: Secondary | ICD-10-CM | POA: Diagnosis not present

## 2024-06-11 DIAGNOSIS — R059 Cough, unspecified: Secondary | ICD-10-CM | POA: Diagnosis not present

## 2024-06-11 DIAGNOSIS — I509 Heart failure, unspecified: Secondary | ICD-10-CM | POA: Diagnosis not present

## 2024-06-11 DIAGNOSIS — E119 Type 2 diabetes mellitus without complications: Secondary | ICD-10-CM | POA: Diagnosis not present

## 2024-06-11 DIAGNOSIS — M25572 Pain in left ankle and joints of left foot: Secondary | ICD-10-CM | POA: Diagnosis not present

## 2024-06-11 DIAGNOSIS — F17210 Nicotine dependence, cigarettes, uncomplicated: Secondary | ICD-10-CM | POA: Diagnosis not present

## 2024-06-11 DIAGNOSIS — D72819 Decreased white blood cell count, unspecified: Secondary | ICD-10-CM | POA: Diagnosis not present

## 2024-06-11 DIAGNOSIS — I110 Hypertensive heart disease with heart failure: Secondary | ICD-10-CM | POA: Diagnosis not present

## 2024-06-11 DIAGNOSIS — Z79899 Other long term (current) drug therapy: Secondary | ICD-10-CM | POA: Diagnosis not present

## 2024-06-11 DIAGNOSIS — Z7982 Long term (current) use of aspirin: Secondary | ICD-10-CM | POA: Diagnosis not present

## 2024-06-11 DIAGNOSIS — J45909 Unspecified asthma, uncomplicated: Secondary | ICD-10-CM | POA: Diagnosis not present

## 2024-06-11 DIAGNOSIS — R197 Diarrhea, unspecified: Secondary | ICD-10-CM | POA: Diagnosis not present

## 2024-06-11 DIAGNOSIS — R1030 Lower abdominal pain, unspecified: Secondary | ICD-10-CM | POA: Diagnosis not present

## 2024-06-12 DIAGNOSIS — R197 Diarrhea, unspecified: Secondary | ICD-10-CM | POA: Diagnosis not present

## 2024-06-16 ENCOUNTER — Telehealth: Payer: Self-pay

## 2024-06-16 NOTE — Telephone Encounter (Signed)
 Copied from CRM [id number]. Topic: General - Other >> Jun 15, 2024  3:13 PM Avram G wrote: Reason for CRM: Jonette is calling to confirm verbal medical diagnosis for diabetes,chronic heart failure or cardio vascular disease.  Reference id #[id number] [phone]   Error for telephone note. Sent CRM to error reporting

## 2024-06-20 ENCOUNTER — Emergency Department

## 2024-06-20 ENCOUNTER — Other Ambulatory Visit: Payer: Self-pay

## 2024-06-20 ENCOUNTER — Emergency Department
Admission: EM | Admit: 2024-06-20 | Discharge: 2024-06-20 | Discharge status: Against Medical Advice | Attending: Emergency Medicine | Admitting: Emergency Medicine

## 2024-06-20 ENCOUNTER — Encounter: Payer: Self-pay | Admitting: Emergency Medicine

## 2024-06-20 DIAGNOSIS — Z5321 Procedure and treatment not carried out due to patient leaving prior to being seen by health care provider: Secondary | ICD-10-CM | POA: Insufficient documentation

## 2024-06-20 DIAGNOSIS — I10 Essential (primary) hypertension: Secondary | ICD-10-CM | POA: Diagnosis not present

## 2024-06-20 DIAGNOSIS — E162 Hypoglycemia, unspecified: Secondary | ICD-10-CM | POA: Insufficient documentation

## 2024-06-20 DIAGNOSIS — I509 Heart failure, unspecified: Secondary | ICD-10-CM | POA: Insufficient documentation

## 2024-06-20 DIAGNOSIS — R0602 Shortness of breath: Secondary | ICD-10-CM | POA: Insufficient documentation

## 2024-06-20 DIAGNOSIS — R064 Hyperventilation: Secondary | ICD-10-CM | POA: Diagnosis not present

## 2024-06-20 DIAGNOSIS — R069 Unspecified abnormalities of breathing: Secondary | ICD-10-CM | POA: Diagnosis not present

## 2024-06-20 DIAGNOSIS — R918 Other nonspecific abnormal finding of lung field: Secondary | ICD-10-CM | POA: Diagnosis not present

## 2024-06-20 LAB — CBC
HCT: 40.3 % (ref 36.0–46.0)
Hemoglobin: 12.8 g/dL (ref 12.0–15.0)
MCH: 29.2 pg (ref 26.0–34.0)
MCHC: 31.8 g/dL (ref 30.0–36.0)
MCV: 91.8 fL (ref 80.0–100.0)
Platelets: 240 K/uL (ref 150–400)
RBC: 4.39 MIL/uL (ref 3.87–5.11)
RDW: 15 % (ref 11.5–15.5)
WBC: 8.8 K/uL (ref 4.0–10.5)
nRBC: 0 % (ref 0.0–0.2)

## 2024-06-20 LAB — TROPONIN I (HIGH SENSITIVITY): Troponin I (High Sensitivity): 10 ng/L (ref <18)

## 2024-06-20 LAB — BASIC METABOLIC PANEL WITH GFR
Anion gap: 10 (ref 5–15)
BUN: 17 mg/dL (ref 8–23)
CO2: 24 mmol/L (ref 22–32)
Calcium: 8.8 mg/dL — ABNORMAL LOW (ref 8.9–10.3)
Chloride: 107 mmol/L (ref 98–111)
Creatinine, Ser: 0.83 mg/dL (ref 0.44–1.00)
GFR, Estimated: >60 mL/min (ref >60)
Glucose, Bld: 85 mg/dL (ref 70–99)
Potassium: 3.7 mmol/L (ref 3.5–5.1)
Sodium: 141 mmol/L (ref 135–145)

## 2024-06-20 LAB — ETHANOL: Alcohol, Ethyl (B): <15 mg/dL (ref <15)

## 2024-06-20 LAB — CBG MONITORING, ED: Glucose-Capillary: 71 mg/dL (ref 70–99)

## 2024-06-20 NOTE — ED Triage Notes (Signed)
 Patient to ED via [facility] from home for SOB. Hx CHF. States pain in chest and abd. Started today. CBG initially 67. Pt given oral glucose and came up to 79.  Per EMS report, pt told them she took a shot of some alcohol earlier today.  154/107 100% RA

## 2024-09-02 NOTE — Telephone Encounter (Signed)
 Left message stating that all of her scripts had refills available and to contact her pharmacy. Left clinic number if she had questions.

## 2024-09-02 NOTE — Telephone Encounter (Signed)
 Copied from CRM #[id number]. Topic: Access To Clinicians - Medication Refill >> Sep 02, 2024  1:14 PM Irma D wrote:  The patient is requesting the following:   Medication(s): All Medication she needs refills she states.   Quantity for:  Pharmacy name and address:   [id number], [place] - [address] AT [facility] [address], [place] [address] Phone: [phone]  Fax: [phone]    Coverage: yes, coverage is accurate on file.  Medication request turnaround time: 72 business hours. (Caller Notified)       Does the caller want to be contacted regarding this request? Yes. Please contact The patient by Cell Phone  Telephone Information:  Mobile [phone]

## 2024-09-13 ENCOUNTER — Other Ambulatory Visit: Payer: Self-pay

## 2024-09-13 ENCOUNTER — Emergency Department

## 2024-09-13 ENCOUNTER — Encounter: Payer: Self-pay | Admitting: Emergency Medicine

## 2024-09-13 ENCOUNTER — Emergency Department
Admission: EM | Admit: 2024-09-13 | Discharge: 2024-09-13 | Disposition: A | Discharge status: Home / Self Care | Attending: Emergency Medicine | Admitting: Emergency Medicine

## 2024-09-13 DIAGNOSIS — M79672 Pain in left foot: Secondary | ICD-10-CM | POA: Insufficient documentation

## 2024-09-13 DIAGNOSIS — E119 Type 2 diabetes mellitus without complications: Secondary | ICD-10-CM | POA: Insufficient documentation

## 2024-09-13 DIAGNOSIS — I509 Heart failure, unspecified: Secondary | ICD-10-CM | POA: Insufficient documentation

## 2024-09-13 DIAGNOSIS — R0789 Other chest pain: Secondary | ICD-10-CM | POA: Diagnosis not present

## 2024-09-13 DIAGNOSIS — I110 Hypertensive heart disease with heart failure: Secondary | ICD-10-CM | POA: Insufficient documentation

## 2024-09-13 DIAGNOSIS — R0602 Shortness of breath: Secondary | ICD-10-CM | POA: Diagnosis present

## 2024-09-13 DIAGNOSIS — R000 Tachycardia, unspecified: Secondary | ICD-10-CM | POA: Diagnosis not present

## 2024-09-13 DIAGNOSIS — M79606 Pain in leg, unspecified: Secondary | ICD-10-CM | POA: Diagnosis not present

## 2024-09-13 DIAGNOSIS — M25572 Pain in left ankle and joints of left foot: Secondary | ICD-10-CM | POA: Diagnosis not present

## 2024-09-13 DIAGNOSIS — J45901 Unspecified asthma with (acute) exacerbation: Secondary | ICD-10-CM | POA: Diagnosis not present

## 2024-09-13 DIAGNOSIS — M7989 Other specified soft tissue disorders: Secondary | ICD-10-CM | POA: Diagnosis not present

## 2024-09-13 DIAGNOSIS — M79605 Pain in left leg: Secondary | ICD-10-CM | POA: Diagnosis not present

## 2024-09-13 DIAGNOSIS — I517 Cardiomegaly: Secondary | ICD-10-CM | POA: Diagnosis not present

## 2024-09-13 LAB — BASIC METABOLIC PANEL WITH GFR
Anion gap: 9 (ref 5–15)
BUN: 20 mg/dL (ref 8–23)
CO2: 27 mmol/L (ref 22–32)
Calcium: 8.9 mg/dL (ref 8.9–10.3)
Chloride: 105 mmol/L (ref 98–111)
Creatinine, Ser: 1.13 mg/dL — ABNORMAL HIGH (ref 0.44–1.00)
GFR, Estimated: 55 mL/min — ABNORMAL LOW (ref >60)
Glucose, Bld: 103 mg/dL — ABNORMAL HIGH (ref 70–99)
Potassium: 4.1 mmol/L (ref 3.5–5.1)
Sodium: 141 mmol/L (ref 135–145)

## 2024-09-13 LAB — CBC
HCT: 39.3 % (ref 36.0–46.0)
Hemoglobin: 12.2 g/dL (ref 12.0–15.0)
MCH: 28.9 pg (ref 26.0–34.0)
MCHC: 31 g/dL (ref 30.0–36.0)
MCV: 93.1 fL (ref 80.0–100.0)
Platelets: 183 K/uL (ref 150–400)
RBC: 4.22 MIL/uL (ref 3.87–5.11)
RDW: 15 % (ref 11.5–15.5)
WBC: 6.9 K/uL (ref 4.0–10.5)
nRBC: 0 % (ref 0.0–0.2)

## 2024-09-13 LAB — TROPONIN I (HIGH SENSITIVITY)
Troponin I (High Sensitivity): 28 ng/L — ABNORMAL HIGH (ref <18)
Troponin I (High Sensitivity): 30 ng/L — ABNORMAL HIGH (ref <18)

## 2024-09-13 LAB — BRAIN NATRIURETIC PEPTIDE: B Natriuretic Peptide: 453.8 pg/mL — ABNORMAL HIGH (ref 0.0–100.0)

## 2024-09-13 MED ORDER — IPRATROPIUM-ALBUTEROL 0.5-2.5 (3) MG/3ML IN SOLN
RESPIRATORY_TRACT | Status: AC
  Administered 2024-09-13: 6 mL via RESPIRATORY_TRACT
  Filled 2024-09-13: qty 6

## 2024-09-13 MED ORDER — PREDNISONE 50 MG PO TABS: 50.0000 mg | ORAL_TABLET | Freq: Every day | ORAL | 0 refills | Status: AC

## 2024-09-13 MED ORDER — METHYLPREDNISOLONE SODIUM SUCC 125 MG IJ SOLR
125.0000 mg | Freq: Once | INTRAMUSCULAR | Status: AC
  Administered 2024-09-13: 125 mg via INTRAVENOUS
  Filled 2024-09-13: qty 2

## 2024-09-13 MED ORDER — ACETAMINOPHEN 500 MG PO TABS
1000.0000 mg | ORAL_TABLET | Freq: Once | ORAL | Status: AC
  Administered 2024-09-13: 1000 mg via ORAL
  Filled 2024-09-13: qty 2

## 2024-09-13 MED ORDER — ASPIRIN 81 MG PO CHEW
324.0000 mg | CHEWABLE_TABLET | Freq: Once | ORAL | Status: AC
  Administered 2024-09-13: 324 mg via ORAL
  Filled 2024-09-13: qty 4

## 2024-09-13 MED ORDER — FUROSEMIDE 10 MG/ML IJ SOLN
40.0000 mg | Freq: Once | INTRAMUSCULAR | Status: AC
  Administered 2024-09-13: 40 mg via INTRAVENOUS
  Filled 2024-09-13: qty 4

## 2024-09-13 MED ORDER — IPRATROPIUM-ALBUTEROL 0.5-2.5 (3) MG/3ML IN SOLN: 6.0000 mL | Freq: Once | RESPIRATORY_TRACT | Status: AC

## 2024-09-13 NOTE — ED Provider Notes (Signed)
 [facility] Provider Note    Event Date/Time   First MD Initiated Contact with Patient 09/13/24 1008     (approximate)   History   Foot Pain (/)   HPI  Jennifer Weiss is a 63 y.o. female past medical history significant for hypertension, diabetes, CHF, seizure disorder, asthma, migraine history, who presents to the emergency department for foot pain.  EMS stated that the patient called out for foot pain.  States that she started having pain to her left leg this morning when she got up.  Hurts to walk on the foot.  States that she is having some swelling to her left ankle.  Denies any falls or trauma.  States that she does not feel well.  Complaining of some mild shortness of breath and chest pain.  States that she has been out of her home medications and that they have refills but she is not going to pick them up and does not have money to afford all of her medications.  Has a follow-up appointment with cardiology on Friday.  In chart review patient had a cardiac catheterization in 2023 that showed clean coronaries.  Does have findings concerning for pulmonary hypertension.     Physical Exam   Triage Vital Signs: ED Triage Vitals [09/13/24 1007]  Encounter Vitals Group     BP      Girls Systolic BP Percentile      Girls Diastolic BP Percentile      Boys Systolic BP Percentile      Boys Diastolic BP Percentile      Pulse      Resp      Temp      Temp src      SpO2      Weight      Height      Head Circumference      Peak Flow      Pain Score 6     Pain Loc      Pain Education      Exclude from Growth Chart     Most recent vital signs: Vitals:   09/13/24 1417 09/13/24 1438  BP: 130/70 128/70  Pulse: 88 80  Resp: 18 18  Temp: 98 F (36.7 C) 98.2 F (36.8 C)  SpO2: 98% 97%    Physical Exam Constitutional:      Appearance: She is well-developed.  HENT:     Head: Atraumatic.  Eyes:     Extraocular Movements: Extraocular movements  intact.     Conjunctiva/sclera: Conjunctivae normal.     Pupils: Pupils are equal, round, and reactive to light.  Cardiovascular:     Rate and Rhythm: Regular rhythm.  Pulmonary:     Effort: No respiratory distress.     Breath sounds: Wheezing present. No rhonchi.  Abdominal:     General: There is no distension.     Tenderness: There is no abdominal tenderness.  Musculoskeletal:        General: Normal range of motion.     Cervical back: Normal range of motion.     Right lower leg: No edema.     Left lower leg: No edema.     Comments: No significant lower extremity edema.  No unilateral leg swelling.  Did have some soft tissue swelling to the left ankle.  Able to range left ankle.  +2 DP pulses that are equal bilaterally.  No open wounds.  No crepitance.  Tenderness to palpation to the left  ankle.  Skin:    General: Skin is warm.  Neurological:     Mental Status: She is alert. Mental status is at baseline.     IMPRESSION / MDM / ASSESSMENT AND PLAN / ED COURSE  I reviewed the triage vital signs and the nursing notes.  Differential diagnosis including fracture, gout/pseudogout, arthritis, DVT, heart failure exacerbation, asthma exacerbation, ACS, anemia, pneumonia  EKG  I, Clotilda Punter, the attending physician, personally viewed and interpreted this ECG.  EKG showed no significant change compared to prior.  Incomplete bundle branch block.  T waves that is inverted to lead III and aVF which appears to be unchanged when compared to prior.  No significant ST elevation.  QTc 469  No tachycardic or bradycardic dysrhythmias while on cardiac telemetry.  RADIOLOGY I independently reviewed imaging, my interpretation of imaging: Chest x-ray with cardiomegaly but no signs of pneumonia or pulmonary edema.  No signs of pneumothorax  DVT ultrasound was negative for acute DVT  LABS (all labs ordered are listed, but only abnormal results are displayed) Labs interpreted as -    Labs  Reviewed  BASIC METABOLIC PANEL WITH GFR - Abnormal; Notable for the following components:      Result Value   Glucose, Bld 103 (*)    Creatinine, Ser 1.13 (*)    GFR, Estimated 55 (*)    All other components within normal limits  BRAIN NATRIURETIC PEPTIDE - Abnormal; Notable for the following components:   B Natriuretic Peptide 453.8 (*)    All other components within normal limits  TROPONIN I (HIGH SENSITIVITY) - Abnormal; Notable for the following components:   Troponin I (High Sensitivity) 28 (*)    All other components within normal limits  TROPONIN I (HIGH SENSITIVITY) - Abnormal; Notable for the following components:   Troponin I (High Sensitivity) 30 (*)    All other components within normal limits  CBC     MDM  Given aspirin  for her chest pain.  EKG without findings of acute ischemia.  Patient has been out of her home medications.  Mild elevation of troponin of 28 which is likely demand ischemia in the setting of mild heart failure exacerbation.  Patient does have significant wheezing on exam concerning for possible asthma exacerbation.  Given DuoNeb treatment and IV Solu-Medrol .  Given extra dose of IV Lasix  40 mg for concern for acute on chronic heart failure exacerbation.  No significant leukocytosis or anemia.  Creatinine appears to be at her baseline with no significant electrolyte abnormality.  Serial troponins remained stable at 30.  BNP mildly elevated in the 400s.  Appears to have been elevated as high as 700 in the past.  Called and discussed the patient's case with cardiology given that she has had a positive troponin with some ongoing chest pain however appears to have chronic chest pain on prior visits and is followed by [facility] cardiology, coronary arteries were clean during catheterization in 2023.  Dr. Florencio felt that he would likely not do any further cardiac workup but stated could be admitted if needed.  Had a low suspicion for ACS.  Discussed with the  patient and states that she has a follow-up appointment with her cardiologist on Friday and she feels okay following up on Friday and returning to the emergency department for any ongoing or worsening symptoms.  Discussed with her daughter over the phone who stated that they could call her primary care physician and discussed the importance of getting her medications picked  up and represcribed to her.  She states that she is taking some of them just not all of them.  Will do a course of steroids.  Discussed return precautions with the patient.  Most likely with asthma exacerbation, will left ankle improved after medication.  No concern for septic joint.     PROCEDURES:  Critical Care performed: No  Procedures  Patient's presentation is most consistent with acute presentation with potential threat to life or bodily function.   MEDICATIONS ORDERED IN ED: Medications  methylPREDNISolone  sodium succinate (SOLU-MEDROL ) 125 mg/2 mL injection 125 mg (has no administration in time range)  acetaminophen  (TYLENOL ) tablet 1,000 mg (1,000 mg Oral Given 09/13/24 1109)  aspirin  chewable tablet 324 mg (324 mg Oral Given 09/13/24 1510)  furosemide  (LASIX ) injection 40 mg (40 mg Intravenous Given 09/13/24 1510)  ipratropium-albuterol  (DUONEB) 0.5-2.5 (3) MG/3ML nebulizer solution 6 mL (6 mLs Nebulization Given 09/13/24 1511)    FINAL CLINICAL IMPRESSION(S) / ED DIAGNOSES   Final diagnoses:  Left foot pain  Exacerbation of persistent asthma, unspecified asthma severity  Acute on chronic congestive heart failure, unspecified heart failure type (HCC)     Rx / DC Orders   ED Discharge Orders          Ordered    predniSONE  (DELTASONE ) 50 MG tablet  Daily with breakfast        09/13/24 1522             Note:  This document was prepared using Dragon voice recognition software and may include unintentional dictation errors.   Suzanne Kirsch, MD 09/13/24 [phone]

## 2024-09-13 NOTE — Discharge Instructions (Addendum)
 You were seen in the emergency department for left foot pain and shortness of breath.  You had an x-ray of your ankle that did not show any broken bones.  You had an ultrasound that did not show any blood clots in your left leg.  Your chest x-ray did not show any signs of pneumonia.  You had a mild elevation of your heart enzyme and likely that this is due to a mild heart failure exacerbation.  Concerned that you are having an asthma exacerbation.  You need to follow-up at your appointment on Friday.  Call your primary care physician tomorrow to discuss how to get your medications.  Return to the emergency department you have any ongoing or worsening symptoms.  You are given an extra dose of IV Lasix  in the emergency department and a dose of IV steroids with breathing treatments.  You are given a prescription for prednisone .  Take as prescribed and using her home inhaler.  Your home medications are at the pharmacy it is important that you go pick them up.  Discussed with your primary care physician if you need further medication assistance.  Watch your salt intake.  Prednisone  -you are given a prescription for a steroid.  It is important that you take this medication with food.  This medication can cause an upset stomach.  It also can increase your glucose if you have a history of diabetes, so it is important that you check your glucose frequently while you are on this medication.

## 2024-09-13 NOTE — ED Triage Notes (Signed)
 Presents via EMS from home with foot pain  States she developed pain this am when she got up Alos states she just hurts all over Has been out of her meds for about 1 month

## 2024-09-16 DIAGNOSIS — Z131 Encounter for screening for diabetes mellitus: Secondary | ICD-10-CM | POA: Diagnosis not present

## 2024-09-16 DIAGNOSIS — F17200 Nicotine dependence, unspecified, uncomplicated: Secondary | ICD-10-CM | POA: Diagnosis not present

## 2024-09-16 DIAGNOSIS — J441 Chronic obstructive pulmonary disease with (acute) exacerbation: Secondary | ICD-10-CM | POA: Diagnosis not present

## 2024-09-16 DIAGNOSIS — M48061 Spinal stenosis, lumbar region without neurogenic claudication: Secondary | ICD-10-CM | POA: Diagnosis not present

## 2024-09-16 DIAGNOSIS — I5020 Unspecified systolic (congestive) heart failure: Secondary | ICD-10-CM | POA: Diagnosis not present

## 2024-09-16 DIAGNOSIS — J45909 Unspecified asthma, uncomplicated: Secondary | ICD-10-CM | POA: Diagnosis not present

## 2024-10-06 DIAGNOSIS — I5022 Chronic systolic (congestive) heart failure: Secondary | ICD-10-CM | POA: Diagnosis not present

## 2024-10-06 DIAGNOSIS — J439 Emphysema, unspecified: Secondary | ICD-10-CM | POA: Diagnosis not present

## 2024-10-06 DIAGNOSIS — E119 Type 2 diabetes mellitus without complications: Secondary | ICD-10-CM | POA: Diagnosis not present

## 2024-10-06 DIAGNOSIS — M79672 Pain in left foot: Secondary | ICD-10-CM | POA: Diagnosis not present

## 2024-10-06 DIAGNOSIS — G43909 Migraine, unspecified, not intractable, without status migrainosus: Secondary | ICD-10-CM | POA: Diagnosis not present

## 2024-10-06 DIAGNOSIS — I110 Hypertensive heart disease with heart failure: Secondary | ICD-10-CM | POA: Diagnosis not present

## 2024-10-06 DIAGNOSIS — J4489 Other specified chronic obstructive pulmonary disease: Secondary | ICD-10-CM | POA: Diagnosis not present

## 2024-10-06 DIAGNOSIS — M48062 Spinal stenosis, lumbar region with neurogenic claudication: Secondary | ICD-10-CM | POA: Diagnosis not present
